# Patient Record
Sex: Female | Born: 1960 | Race: White | Hispanic: No | Smoking: Former smoker
Health system: Southern US, Community
[De-identification: ages and names within clinical notes are randomized; demographics above are authoritative.]

## PROBLEM LIST (undated history)

## (undated) DIAGNOSIS — M311 Thrombotic microangiopathy: Secondary | ICD-10-CM

## (undated) DIAGNOSIS — I10 Essential (primary) hypertension: Secondary | ICD-10-CM

## (undated) DIAGNOSIS — E7800 Pure hypercholesterolemia, unspecified: Secondary | ICD-10-CM

## (undated) DIAGNOSIS — I639 Cerebral infarction, unspecified: Secondary | ICD-10-CM

## (undated) DIAGNOSIS — M3119 Other thrombotic microangiopathy: Secondary | ICD-10-CM

## (undated) DIAGNOSIS — D638 Anemia in other chronic diseases classified elsewhere: Secondary | ICD-10-CM

## (undated) DIAGNOSIS — F32A Depression, unspecified: Secondary | ICD-10-CM

## (undated) DIAGNOSIS — F329 Major depressive disorder, single episode, unspecified: Secondary | ICD-10-CM

## (undated) HISTORY — PX: ABDOMINAL HYSTERECTOMY: SHX81

## (undated) HISTORY — DX: Anemia in other chronic diseases classified elsewhere: D63.8

## (undated) HISTORY — PX: TONSILLECTOMY: SUR1361

---

## 2001-08-07 ENCOUNTER — Encounter: Payer: Self-pay | Admitting: Internal Medicine

## 2001-08-07 ENCOUNTER — Ambulatory Visit (HOSPITAL_COMMUNITY): Admission: RE | Admit: 2001-08-07 | Discharge: 2001-08-07 | Payer: Self-pay | Admitting: Internal Medicine

## 2001-08-13 ENCOUNTER — Emergency Department (HOSPITAL_COMMUNITY): Admission: EM | Admit: 2001-08-13 | Discharge: 2001-08-13 | Payer: Self-pay | Admitting: Emergency Medicine

## 2001-08-13 ENCOUNTER — Encounter: Payer: Self-pay | Admitting: Emergency Medicine

## 2001-08-21 ENCOUNTER — Ambulatory Visit (HOSPITAL_COMMUNITY): Admission: RE | Admit: 2001-08-21 | Discharge: 2001-08-21 | Payer: Self-pay | Admitting: Internal Medicine

## 2007-01-25 ENCOUNTER — Ambulatory Visit (HOSPITAL_COMMUNITY): Admission: RE | Admit: 2007-01-25 | Discharge: 2007-01-25 | Payer: Self-pay | Admitting: Family Medicine

## 2007-05-17 ENCOUNTER — Emergency Department (HOSPITAL_COMMUNITY): Admission: EM | Admit: 2007-05-17 | Discharge: 2007-05-17 | Payer: Self-pay | Admitting: Emergency Medicine

## 2007-05-29 ENCOUNTER — Emergency Department (HOSPITAL_COMMUNITY): Admission: EM | Admit: 2007-05-29 | Discharge: 2007-05-29 | Payer: Self-pay | Admitting: Emergency Medicine

## 2010-08-13 NOTE — Procedures (Signed)
[facility]  Patient:    Kaitlyn Valdez, Kaitlyn Valdez Visit Number: [id number] MRN: [id number]          Service Type: OUT Location: RAD Attending Physician:  Elliot Cousin Dictated by:   Houtzdale Bing, M.D. Admit Date:  08/20/2001 Discharge Date: 08/20/2001   CC:         Elliot Cousin, M.D.   Echocardiograms  REFERRING PHYSICIAN:  Elliot Cousin, M.D.  CLINICAL DATA:  A 50 year old woman with chest pain.  INTERPRETATION: 1. Technically adequate echocardiographic study. 2. Normal left atrium, right atrium, and right ventricle. 3. Normal aortic, tricuspid, and pulmonic valves. 4. Mild mitral valve thickening with normal function and no prolapse. 5. Normal Doppler study with physiologic tricuspid regurgitation. 6. Normal internal dimension, wall thickness, regional and global function of    the left ventricle. 7. Normal IVC. Dictated by:   Puako Bing, M.D. Attending Physician:  Elliot Cousin DD:  08/21/01 TD:  08/22/01 Job: [id number]

## 2010-10-20 ENCOUNTER — Other Ambulatory Visit (HOSPITAL_COMMUNITY): Payer: Self-pay | Admitting: Family Medicine

## 2010-10-20 DIAGNOSIS — Z139 Encounter for screening, unspecified: Secondary | ICD-10-CM

## 2010-10-25 ENCOUNTER — Ambulatory Visit (HOSPITAL_COMMUNITY)
Admission: RE | Admit: 2010-10-25 | Discharge: 2010-10-25 | Disposition: A | Discharge status: Home / Self Care | Payer: Medicare Other | Source: Ambulatory Visit | Attending: Family Medicine | Admitting: Family Medicine

## 2010-10-25 DIAGNOSIS — Z1231 Encounter for screening mammogram for malignant neoplasm of breast: Secondary | ICD-10-CM | POA: Insufficient documentation

## 2010-10-25 DIAGNOSIS — Z139 Encounter for screening, unspecified: Secondary | ICD-10-CM

## 2013-03-08 ENCOUNTER — Telehealth: Payer: Self-pay

## 2013-03-08 NOTE — Telephone Encounter (Signed)
Pt was referred by Urgent Care at [facility] in [place] by Marva Panda for screening colonoscopy. I spoke to pt and she wanted me to speak to her brother, Orvilla Fus.  I scheduled her an office visit on 04/08/2013 at 11:00 AM with Gerrit Halls, NP. The notes indicated in her referral that she was to have platelets rechecked. Also, I'm not sure patient understood about having a colonoscopy.

## 2013-04-01 ENCOUNTER — Other Ambulatory Visit (HOSPITAL_COMMUNITY): Payer: Self-pay | Admitting: *Deleted

## 2013-04-01 DIAGNOSIS — Z139 Encounter for screening, unspecified: Secondary | ICD-10-CM

## 2013-04-04 ENCOUNTER — Ambulatory Visit (HOSPITAL_COMMUNITY): Payer: Medicare Other

## 2013-04-08 ENCOUNTER — Ambulatory Visit: Payer: Medicare Other | Admitting: Gastroenterology

## 2013-04-22 ENCOUNTER — Inpatient Hospital Stay (HOSPITAL_COMMUNITY)
Admission: AD | Admit: 2013-04-22 | Discharge: 2013-05-01 | DRG: 064 | Disposition: A | Discharge status: Skilled Nursing Facility | Payer: Medicare HMO | Source: Other Acute Inpatient Hospital | Attending: Internal Medicine | Admitting: Internal Medicine

## 2013-04-22 ENCOUNTER — Inpatient Hospital Stay (HOSPITAL_COMMUNITY): Payer: Medicare HMO

## 2013-04-22 DIAGNOSIS — I129 Hypertensive chronic kidney disease with stage 1 through stage 4 chronic kidney disease, or unspecified chronic kidney disease: Secondary | ICD-10-CM | POA: Diagnosis present

## 2013-04-22 DIAGNOSIS — R17 Unspecified jaundice: Secondary | ICD-10-CM | POA: Diagnosis present

## 2013-04-22 DIAGNOSIS — Z880 Allergy status to penicillin: Secondary | ICD-10-CM

## 2013-04-22 DIAGNOSIS — R339 Retention of urine, unspecified: Secondary | ICD-10-CM | POA: Diagnosis not present

## 2013-04-22 DIAGNOSIS — F32A Depression, unspecified: Secondary | ICD-10-CM | POA: Diagnosis present

## 2013-04-22 DIAGNOSIS — B1920 Unspecified viral hepatitis C without hepatic coma: Secondary | ICD-10-CM | POA: Diagnosis present

## 2013-04-22 DIAGNOSIS — R233 Spontaneous ecchymoses: Secondary | ICD-10-CM | POA: Diagnosis present

## 2013-04-22 DIAGNOSIS — I63511 Cerebral infarction due to unspecified occlusion or stenosis of right middle cerebral artery: Secondary | ICD-10-CM

## 2013-04-22 DIAGNOSIS — Z8249 Family history of ischemic heart disease and other diseases of the circulatory system: Secondary | ICD-10-CM

## 2013-04-22 DIAGNOSIS — G8190 Hemiplegia, unspecified affecting unspecified side: Secondary | ICD-10-CM | POA: Diagnosis present

## 2013-04-22 DIAGNOSIS — D599 Acquired hemolytic anemia, unspecified: Secondary | ICD-10-CM | POA: Diagnosis present

## 2013-04-22 DIAGNOSIS — I498 Other specified cardiac arrhythmias: Secondary | ICD-10-CM | POA: Diagnosis not present

## 2013-04-22 DIAGNOSIS — Z8673 Personal history of transient ischemic attack (TIA), and cerebral infarction without residual deficits: Secondary | ICD-10-CM

## 2013-04-22 DIAGNOSIS — R1310 Dysphagia, unspecified: Secondary | ICD-10-CM | POA: Diagnosis present

## 2013-04-22 DIAGNOSIS — N183 Chronic kidney disease, stage 3 unspecified: Secondary | ICD-10-CM | POA: Diagnosis present

## 2013-04-22 DIAGNOSIS — E870 Hyperosmolality and hypernatremia: Secondary | ICD-10-CM | POA: Diagnosis not present

## 2013-04-22 DIAGNOSIS — F3289 Other specified depressive episodes: Secondary | ICD-10-CM | POA: Diagnosis present

## 2013-04-22 DIAGNOSIS — N179 Acute kidney failure, unspecified: Secondary | ICD-10-CM | POA: Diagnosis present

## 2013-04-22 DIAGNOSIS — G936 Cerebral edema: Secondary | ICD-10-CM | POA: Diagnosis present

## 2013-04-22 DIAGNOSIS — E785 Hyperlipidemia, unspecified: Secondary | ICD-10-CM | POA: Diagnosis present

## 2013-04-22 DIAGNOSIS — R319 Hematuria, unspecified: Secondary | ICD-10-CM | POA: Diagnosis present

## 2013-04-22 DIAGNOSIS — I6350 Cerebral infarction due to unspecified occlusion or stenosis of unspecified cerebral artery: Principal | ICD-10-CM | POA: Diagnosis present

## 2013-04-22 DIAGNOSIS — E876 Hypokalemia: Secondary | ICD-10-CM | POA: Diagnosis not present

## 2013-04-22 DIAGNOSIS — D696 Thrombocytopenia, unspecified: Secondary | ICD-10-CM | POA: Diagnosis present

## 2013-04-22 DIAGNOSIS — M311 Thrombotic microangiopathy, unspecified: Secondary | ICD-10-CM | POA: Diagnosis present

## 2013-04-22 DIAGNOSIS — M3119 Other thrombotic microangiopathy: Secondary | ICD-10-CM | POA: Diagnosis present

## 2013-04-22 DIAGNOSIS — F329 Major depressive disorder, single episode, unspecified: Secondary | ICD-10-CM

## 2013-04-22 DIAGNOSIS — Z7902 Long term (current) use of antithrombotics/antiplatelets: Secondary | ICD-10-CM

## 2013-04-22 DIAGNOSIS — I69998 Other sequelae following unspecified cerebrovascular disease: Secondary | ICD-10-CM

## 2013-04-22 DIAGNOSIS — F17200 Nicotine dependence, unspecified, uncomplicated: Secondary | ICD-10-CM | POA: Diagnosis present

## 2013-04-22 DIAGNOSIS — R040 Epistaxis: Secondary | ICD-10-CM | POA: Diagnosis not present

## 2013-04-22 DIAGNOSIS — I10 Essential (primary) hypertension: Secondary | ICD-10-CM | POA: Diagnosis present

## 2013-04-22 DIAGNOSIS — F411 Generalized anxiety disorder: Secondary | ICD-10-CM | POA: Diagnosis present

## 2013-04-22 DIAGNOSIS — G9340 Encephalopathy, unspecified: Secondary | ICD-10-CM | POA: Diagnosis present

## 2013-04-22 HISTORY — DX: Cerebral infarction, unspecified: I63.9

## 2013-04-22 HISTORY — DX: Thrombotic microangiopathy: M31.1

## 2013-04-22 HISTORY — DX: Depression, unspecified: F32.A

## 2013-04-22 HISTORY — DX: Essential (primary) hypertension: I10

## 2013-04-22 HISTORY — DX: Major depressive disorder, single episode, unspecified: F32.9

## 2013-04-22 HISTORY — DX: Pure hypercholesterolemia, unspecified: E78.00

## 2013-04-22 HISTORY — DX: Other thrombotic microangiopathy: M31.19

## 2013-04-22 LAB — COMPREHENSIVE METABOLIC PANEL
ALK PHOS: 70 U/L (ref 39–117)
ALT: 28 U/L (ref 0–35)
AST: 54 U/L — AB (ref 0–37)
Albumin: 4 g/dL (ref 3.5–5.2)
BUN: 26 mg/dL — ABNORMAL HIGH (ref 6–23)
CALCIUM: 9.7 mg/dL (ref 8.4–10.5)
CO2: 21 mEq/L (ref 19–32)
Chloride: 101 mEq/L (ref 96–112)
Creatinine, Ser: 2.49 mg/dL — ABNORMAL HIGH (ref 0.50–1.10)
GFR calc Af Amer: 24 mL/min — ABNORMAL LOW (ref >90)
GFR, EST NON AFRICAN AMERICAN: 21 mL/min — AB (ref >90)
GLUCOSE: 107 mg/dL — AB (ref 70–99)
Potassium: 4.6 mEq/L (ref 3.7–5.3)
SODIUM: 139 meq/L (ref 137–147)
Total Bilirubin: 1.9 mg/dL — ABNORMAL HIGH (ref 0.3–1.2)
Total Protein: 7.4 g/dL (ref 6.0–8.3)

## 2013-04-22 LAB — CBC WITH DIFFERENTIAL/PLATELET
HCT: 33.4 % — ABNORMAL LOW (ref 36.0–46.0)
Hemoglobin: 11.6 g/dL — ABNORMAL LOW (ref 12.0–15.0)
LYMPHS ABS: 1.1 10*3/uL (ref 0.7–4.0)
Lymphocytes Relative: 18 % (ref 12–46)
MCH: 31.6 pg (ref 26.0–34.0)
MCHC: 34.7 g/dL (ref 30.0–36.0)
MCV: 91 fL (ref 78.0–100.0)
Monocytes Absolute: 1.1 10*3/uL — ABNORMAL HIGH (ref 0.1–1.0)
Monocytes Relative: 18 % — ABNORMAL HIGH (ref 3–12)
NEUTROS PCT: 64 % (ref 43–77)
Neutro Abs: 3.8 10*3/uL (ref 1.7–7.7)
PLATELETS: 10 10*3/uL — AB (ref 150–400)
RBC: 3.67 MIL/uL — AB (ref 3.87–5.11)
RDW: 14.1 % (ref 11.5–15.5)
WBC: 6.4 10*3/uL (ref 4.0–10.5)

## 2013-04-22 LAB — PROTIME-INR
INR: 1.11 (ref 0.00–1.49)
Prothrombin Time: 14.1 seconds (ref 11.6–15.2)

## 2013-04-22 LAB — PHOSPHORUS: Phosphorus: 5.4 mg/dL — ABNORMAL HIGH (ref 2.3–4.6)

## 2013-04-22 LAB — LACTATE DEHYDROGENASE: LDH: 946 U/L — ABNORMAL HIGH (ref 94–250)

## 2013-04-22 LAB — MRSA PCR SCREENING: MRSA by PCR: NEGATIVE

## 2013-04-22 LAB — MAGNESIUM: Magnesium: 2 mg/dL (ref 1.5–2.5)

## 2013-04-22 LAB — APTT: aPTT: 36 seconds (ref 24–37)

## 2013-04-22 MED ORDER — ONDANSETRON HCL 4 MG/2ML IJ SOLN
4.0000 mg | Freq: Three times a day (TID) | INTRAMUSCULAR | Status: DC | PRN
  Administered 2013-04-22: 4 mg via INTRAVENOUS
  Filled 2013-04-22: qty 2

## 2013-04-22 MED ORDER — ACETAMINOPHEN 325 MG PO TABS
650.0000 mg | ORAL_TABLET | ORAL | Status: DC | PRN
Start: 2013-04-22 — End: 2013-04-25

## 2013-04-22 MED ORDER — PANTOPRAZOLE SODIUM 40 MG IV SOLR
40.0000 mg | INTRAVENOUS | Status: DC
  Administered 2013-04-22 – 2013-04-26 (×5): 40 mg via INTRAVENOUS
  Filled 2013-04-22 (×6): qty 40

## 2013-04-22 MED ORDER — DIPHENHYDRAMINE HCL 50 MG/ML IJ SOLN
25.0000 mg | Freq: Four times a day (QID) | INTRAMUSCULAR | Status: DC | PRN
  Administered 2013-04-27: 25 mg via INTRAVENOUS
  Filled 2013-04-22 (×2): qty 1

## 2013-04-22 MED ORDER — SODIUM CHLORIDE 0.9 % IV SOLN
INTRAVENOUS | Status: DC
  Administered 2013-04-23: 1000 mL via INTRAVENOUS

## 2013-04-22 MED ORDER — HEPARIN SODIUM (PORCINE) 1000 UNIT/ML IJ SOLN
3000.0000 [IU] | Freq: Once | INTRAMUSCULAR | Status: AC
Start: 2013-04-22 — End: 2013-04-22
  Administered 2013-04-22: 2800 [IU]
  Filled 2013-04-22: qty 3

## 2013-04-22 NOTE — Procedures (Signed)
Hemodialysis Catheter Insertion Procedure Note Kaitlyn Valdez [id number] [birth year]  Procedure: Insertion of Central Venous Catheter Indications: Hemodialysis  Procedure Details Consent: Risks of procedure as well as the alternatives and risks of each were explained to the (patient/caregiver).  Consent for procedure obtained.  Time Out: Verified patient identification, verified procedure, site/side was marked, verified correct patient position, special equipment/implants available, medications/allergies/relevent history reviewed, required imaging and test results available.   Performed  Maximum sterile technique was used including antiseptics, cap, gloves, gown, hand hygiene, mask and sheet. Skin prep: Chlorhexidine; local anesthetic administered  A triple lumen hemodialysis catheter was placed in the left internal jugular vein to 20 cm using the Seldinger technique.  Evaluation Blood flow good Complications: No apparent complications Patient did tolerate procedure well. Chest X-ray ordered to verify placement.  CXR: pending.  Procedure performed under direct supervision of Dr. Sung AmabileSimonds and with ultrasound guidance for real time vessel cannulation.      Kaitlyn BrimBrandi Ollis, NP-C [place] Pgr: [phone] or [phone]    04/22/2013, 9:47 PM  I was present for and supervised the entire procedure  Billy Fischeravid Simonds, MD ; [facility] service Mobile [phone].  After 5:30 PM or weekends, call [phone]

## 2013-04-22 NOTE — H&P (Signed)
PULMONARY / CRITICAL CARE MEDICINE  Name: Kaitlyn Valdez MRN: [id number] DOB: 1960-12-03    ADMISSION DATE:  04/22/2013 CONSULTATION DATE:  04/22/2013  [facility]ING [facility] :  [facility] ED[facility] PRIMARY SERVICE:  PCCM  CHIEF COMPLAINT:  Acute encephalopathy  BRIEF PATIENT DESCRIPTION: 53 yo with past medical history of CVA and baseline L side neglect found down at home, covered in emesis, confused with slurred speech and worsen L sided neglect.  Workup revealed thrombocytopenia.  Head CT revealed no acute findings but multiple prior infarcts. [facility]   SIGNIFICANT EVENTS / STUDIES:  1/26  Found down, low platelets 1/26  Head CT [facility] >>> nad, multiple old infarcts 1/26  [facility]   LINES / TUBES:  CULTURES: 1/26 Blood >>> 1/26 Urine >>>  ANTIBIOTICS:  The patient is encephalopathic and unable to provide history, which was obtained for available medical records.  HISTORY OF PRESENT ILLNESS:  53 yo with past medical history of CVA and baseline L side neglect found down at home, covered in emesis, confused with slurred speech and worsen L sided neglect.  Workup revealed thrombocytopenia.  Head CT revealed no acute findings but multiple prior infarcts. [facility].   PAST MEDICAL HISTORY :  No past medical history on file. No past surgical history on file. Prior to Admission medications   Not on File   Allergies not on file  FAMILY HISTORY:  No family history on file.  SOCIAL HISTORY:  has no tobacco, alcohol, and drug history on file.  REVIEW OF SYSTEMS:  Unable to provide.  INTERVAL HISTORY:  VITAL SIGNS: Temp:  [99.2 F (37.3 C)] 99.2 F (37.3 C) (01/26 1900) Pulse Rate:  [70-82] 82 (01/26 1900) Resp:  [18-19] 19 (01/26 1900) BP: (118)/(77) 118/77 mmHg (01/26 1900) SpO2:  [100 %] 100 % (01/26 1900) HEMODYNAMICS:   VENTILATOR SETTINGS:   INTAKE / OUTPUT: Intake/Output   None     PHYSICAL  EXAMINATION: General:  Appears ill Neuro:  Encephalopathic, with L sided neglect, cough / gag present HEENT:  Dry membranes Cardiovascular:  Regular tachycardic Lungs:  Bilateral air entry Abdomen:  Soft, nontender, bowel sounds diminished Musculoskeletal:  Moves all extremities, no edema Skin:  Petechiae in the back, and excoriations UE  LABS: CBC No results found for this basename: WBC, HGB, HCT, PLT,  in the last 168 hours Coag's No results found for this basename: APTT, INR,  in the last 168 hours BMET No results found for this basename: NA, K, CL, CO2, BUN, CREATININE, GLUCOSE,  in the last 168 hours Electrolytes No results found for this basename: CALCIUM, MG, PHOS,  in the last 168 hours Sepsis Markers No results found for this basename: LATICACIDVEN, PROCALCITON, O2SATVEN,  in the last 168 hours ABG No results found for this basename: PHART, PCO2ART, PO2ART,  in the last 168 hours Liver Enzymes No results found for this basename: AST, ALT, ALKPHOS, BILITOT, ALBUMIN,  in the last 168 hours Cardiac Enzymes No results found for this basename: TROPONINI, PROBNP,  in the last 168 hours Glucose No results found for this basename: GLUCAP,  in the last 168 hours  CXR:  1/26 >>>  ASSESSMENT / PLAN:  PULMONARY A:   Protects airway for now P:   Goal SpO2>92 Supplemental oxygen  CARDIOVASCULAR A:  H/o HTN, dyslipidemia P:  Goal MAP>60 Hold Plavix / Valsartan / Pravachol  RENAL A:   No active issues P:  Trend BMP NS@100   GASTROINTESTINAL A:   Nutrition GI Px P:   NPO Avoid NGT while thrombocytopenic Protonix for GI Px LFT  HEMATOLOGIC A:   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 MRI  I have personally obtained history, examined patient, evaluated and interpreted laboratory and imaging results, reviewed medical records, formulated assessment / plan and placed orders.  CRITICAL CARE:  The patient is critically ill with multiple organ systems failure and requires high complexity decision making for assessment and support, frequent evaluation and titration of therapies, application of advanced monitoring technologies and extensive interpretation of multiple databases. Critical Care Time devoted to patient care services described in this note is 40 minutes.   Lonia Farber, MD Pulmonary and Critical Care Medicine [facility] Pager: [phone]  04/22/2013, 8:58 PM

## 2013-04-22 NOTE — Progress Notes (Signed)
Discussed case with Dr. Marisue HumbleSanford.  Verified that Hematology has been called for consult (was called earlier in the evening).  LDH requested per Renal & ordered.    Canary BrimBrandi Aneka Fagerstrom, NP-C [place] [facility] Pgr: [phone] or [phone]

## 2013-04-22 NOTE — Consult Note (Addendum)
Kaitlyn Valdez 04/22/2013 Kaitlyn Valdez Requesting Physician:  Zubelevitskiy  Reason for Consult:  Concern for TTP, req for plasmapheresis, called at 10:30pm.  HPI:  2F w/ unclear medical history but potentially including CVA (notes mention L sided neglect), MDD and recent medication change per [facility] ED notes tranferred to [facility] after presenting to [facility] with AMS, N/V and finding TCP with PLT of 22 and concern for TTP and potential for TPE.  Pt is awake and alert but unable to provide further history.  Family is currently unavailable.    Here pt with PLT count of 10, schistocytes on CBC, Tbili 1.9, and Hb of 11.6.  PT/PTT are normal.  SCr is 2.49 with unknown baseline; UA at OSH with significant hematuria.  She has been afebrile to this point.     Creatinine, Ser (mg/dL)  Date Value  09/15/3084 2.49*    ROS Unable to obtain  PMH: unable to obtain PSH unable to obtain FH unable to obtain SH unable to obtain Allergies unable to obtain Home medications Prior to Admission medications   Not on File    Current Medications Current Facility-Administered Medications  Medication Dose Route Frequency Provider Last Rate Last Dose  . 0.9 %  sodium chloride infusion   Intravenous Continuous Lonia Farber, MD      . acetaminophen (TYLENOL) tablet 650 mg  650 mg Oral Q4H PRN Lonia Farber, MD      . diphenhydrAMINE (BENADRYL) injection 25 mg  25 mg Intravenous Q6H PRN Lonia Farber, MD      . ondansetron (ZOFRAN) injection 4 mg  4 mg Intravenous Q8H PRN Courtney Paris, MD   4 mg at 04/22/13 2239  . pantoprazole (PROTONIX) injection 40 mg  40 mg Intravenous Q24H Lonia Farber, MD   40 mg at 04/22/13 2000    CBC  Recent Labs Lab 04/22/13 2105  WBC 6.4  NEUTROABS 3.8  HGB 11.6*  HCT 33.4*  MCV 91.0  PLT 10*   Basic Metabolic Panel  Recent Labs Lab 04/22/13 2105  NA 139  K 4.6  CL 101  CO2 21  GLUCOSE 107*  BUN 26*  CREATININE 2.49*   CALCIUM 9.7  PHOS 5.4*    Physical Exam  Blood pressure 107/80, pulse 77, temperature 99.2 F (37.3 C), temperature source Oral, resp. rate 20, SpO2 96.00%. GEN: NAD, lying in bed, awake but not oriented ENT: fair dentition.  NECK: L IJ HD catheter in place EYES: Sclera and conjunctiva clear CV: RRR, nl s1s2 PULM: CTAB, nl wob ABD: s/nt/nd.  SKIN: no rashes/lesions.  No clear petechiate EXT:no LEE   A/P 2F presenting with AMS, TCP, mild anemia with schistocytes on CBC, and elevated SCr (unclear chronicity at this time) with normal PT/pTT, no history of diarrhea or fever.  From the [facility] notes there is mention of new medication for depression started last week.    Certainly this could be TTP.  I have spoken with hematology on call and reviewed case; after their review there are some concerns this could be something other than TTP and they intend to see in AM.  I have offered to order TPE therapy tonight and HD catheter has been placed.  Should mental status worsen tonight or seizures develop I think immediate TPE should be reconsidered.    ASSESMENT 1. TCP, ? TTP 2. Renal Failure, unclear acute vs chronic 3. AMS  PLAN 1. Ordered ADAMTS13 Activity, Retic Count, Smear held, LDH 2. Available to order TPE 3.  Follow daily renal function -- WOuld benefit from old records at [facility] or [facility] 4. UA and Renal US 5. NSAID avoidance 6. I defer consideration of corticosteroids to hematology  Sabra Heckyan Suellen Durocher MD 04/22/2013, 11:16 PM

## 2013-04-23 ENCOUNTER — Inpatient Hospital Stay (HOSPITAL_COMMUNITY): Payer: Medicare HMO

## 2013-04-23 ENCOUNTER — Encounter (HOSPITAL_COMMUNITY): Payer: Self-pay | Admitting: Internal Medicine

## 2013-04-23 DIAGNOSIS — I63511 Cerebral infarction due to unspecified occlusion or stenosis of right middle cerebral artery: Secondary | ICD-10-CM

## 2013-04-23 DIAGNOSIS — N179 Acute kidney failure, unspecified: Secondary | ICD-10-CM

## 2013-04-23 DIAGNOSIS — D649 Anemia, unspecified: Secondary | ICD-10-CM

## 2013-04-23 DIAGNOSIS — F3289 Other specified depressive episodes: Secondary | ICD-10-CM

## 2013-04-23 DIAGNOSIS — M311 Thrombotic microangiopathy, unspecified: Secondary | ICD-10-CM

## 2013-04-23 DIAGNOSIS — R509 Fever, unspecified: Secondary | ICD-10-CM

## 2013-04-23 DIAGNOSIS — Z8673 Personal history of transient ischemic attack (TIA), and cerebral infarction without residual deficits: Secondary | ICD-10-CM

## 2013-04-23 DIAGNOSIS — F329 Major depressive disorder, single episode, unspecified: Secondary | ICD-10-CM

## 2013-04-23 LAB — RETICULOCYTES
RBC.: 3.2 MIL/uL — AB (ref 3.87–5.11)
RETIC CT PCT: 1.2 % (ref 0.4–3.1)
Retic Count, Absolute: 38.4 10*3/uL (ref 19.0–186.0)

## 2013-04-23 LAB — URINALYSIS, ROUTINE W REFLEX MICROSCOPIC
GLUCOSE, UA: NEGATIVE mg/dL
KETONES UR: 15 mg/dL — AB
Leukocytes, UA: NEGATIVE
Nitrite: NEGATIVE
PH: 6.5 (ref 5.0–8.0)
Protein, ur: >300 mg/dL — AB
Specific Gravity, Urine: 1.021 (ref 1.005–1.030)
Urobilinogen, UA: 1 mg/dL (ref 0.0–1.0)

## 2013-04-23 LAB — RENAL FUNCTION PANEL
Albumin: 3.3 g/dL — ABNORMAL LOW (ref 3.5–5.2)
BUN: 30 mg/dL — ABNORMAL HIGH (ref 6–23)
CO2: 29 meq/L (ref 19–32)
Calcium: 8.9 mg/dL (ref 8.4–10.5)
Chloride: 102 mEq/L (ref 96–112)
Creatinine, Ser: 2.67 mg/dL — ABNORMAL HIGH (ref 0.50–1.10)
GFR calc non Af Amer: 19 mL/min — ABNORMAL LOW (ref >90)
GFR, EST AFRICAN AMERICAN: 22 mL/min — AB (ref >90)
GLUCOSE: 91 mg/dL (ref 70–99)
POTASSIUM: 3.5 meq/L — AB (ref 3.7–5.3)
Phosphorus: 4.4 mg/dL (ref 2.3–4.6)
SODIUM: 144 meq/L (ref 137–147)

## 2013-04-23 LAB — POCT I-STAT, CHEM 8
BUN: 33 mg/dL — AB (ref 6–23)
CHLORIDE: 104 meq/L (ref 96–112)
Calcium, Ion: 1.23 mmol/L (ref 1.12–1.23)
Creatinine, Ser: 3 mg/dL — ABNORMAL HIGH (ref 0.50–1.10)
Glucose, Bld: 102 mg/dL — ABNORMAL HIGH (ref 70–99)
HEMATOCRIT: 28 % — AB (ref 36.0–46.0)
Hemoglobin: 9.5 g/dL — ABNORMAL LOW (ref 12.0–15.0)
POTASSIUM: 3.9 meq/L (ref 3.7–5.3)
Sodium: 142 mEq/L (ref 137–147)
TCO2: 23 mmol/L (ref 0–100)

## 2013-04-23 LAB — URINE MICROSCOPIC-ADD ON

## 2013-04-23 LAB — GLUCOSE, CAPILLARY
GLUCOSE-CAPILLARY: 165 mg/dL — AB (ref 70–99)
GLUCOSE-CAPILLARY: 95 mg/dL (ref 70–99)
GLUCOSE-CAPILLARY: 98 mg/dL (ref 70–99)
Glucose-Capillary: 104 mg/dL — ABNORMAL HIGH (ref 70–99)
Glucose-Capillary: 103 mg/dL — ABNORMAL HIGH (ref 70–99)
Glucose-Capillary: 103 mg/dL — ABNORMAL HIGH (ref 70–99)
Glucose-Capillary: 93 mg/dL (ref 70–99)

## 2013-04-23 LAB — ABO/RH: ABO/RH(D): A POS

## 2013-04-23 LAB — HEMOGLOBIN A1C
HEMOGLOBIN A1C: 4.8 % (ref <5.7)
Mean Plasma Glucose: 91 mg/dL (ref <117)

## 2013-04-23 LAB — SAVE SMEAR

## 2013-04-23 LAB — PREPARE PLATELET PHERESIS: UNIT DIVISION: 0

## 2013-04-23 LAB — SODIUM: Sodium: 149 mEq/L — ABNORMAL HIGH (ref 137–147)

## 2013-04-23 LAB — HAPTOGLOBIN: Haptoglobin: 68 mg/dL (ref 45–215)

## 2013-04-23 MED ORDER — HALOPERIDOL LACTATE 5 MG/ML IJ SOLN
2.0000 mg | INTRAMUSCULAR | Status: AC | PRN
  Administered 2013-04-24 – 2013-04-29 (×3): 2 mg via INTRAVENOUS
  Filled 2013-04-23 (×3): qty 1

## 2013-04-23 MED ORDER — THIAMINE HCL 100 MG/ML IJ SOLN: 500.0000 mg | Freq: Every day | INTRAMUSCULAR | Status: DC

## 2013-04-23 MED ORDER — DIPHENHYDRAMINE HCL 25 MG PO CAPS: 25.0000 mg | ORAL_CAPSULE | Freq: Four times a day (QID) | ORAL | Status: DC | PRN

## 2013-04-23 MED ORDER — SODIUM CHLORIDE 3 % IV SOLN
INTRAVENOUS | Status: DC
  Administered 2013-04-23: 23:00:00 via INTRAVENOUS
  Administered 2013-04-23: 75 mL/h via INTRAVENOUS
  Administered 2013-04-24: 37.5 mL/h via INTRAVENOUS
  Filled 2013-04-23 (×5): qty 500

## 2013-04-23 MED ORDER — ANTICOAGULANT SODIUM CITRATE 4% (200MG/5ML) IV SOLN
5.0000 mL | Freq: Once | Status: AC
  Administered 2013-04-23: 5 mL
  Filled 2013-04-23: qty 250

## 2013-04-23 MED ORDER — ACD FORMULA A 0.73-2.45-2.2 GM/100ML VI SOLN
500.0000 mL | Status: DC
  Administered 2013-04-23: 500 mL via INTRAVENOUS
  Filled 2013-04-23: qty 500

## 2013-04-23 MED ORDER — ACD FORMULA A 0.73-2.45-2.2 GM/100ML VI SOLN
Status: AC
  Administered 2013-04-23: 500 mL via INTRAVENOUS
  Filled 2013-04-23: qty 500

## 2013-04-23 MED ORDER — ACETAMINOPHEN 325 MG PO TABS: 650.0000 mg | ORAL_TABLET | ORAL | Status: DC | PRN

## 2013-04-23 MED ORDER — CALCIUM GLUCONATE 10 % IV SOLN
2.0000 g | Freq: Once | INTRAVENOUS | Status: DC
  Filled 2013-04-23: qty 20

## 2013-04-23 MED ORDER — SODIUM CHLORIDE 0.9 % IV SOLN
4.0000 g | Freq: Once | INTRAVENOUS | Status: AC
  Administered 2013-04-23: 4 g via INTRAVENOUS
  Filled 2013-04-23: qty 40

## 2013-04-23 NOTE — Consult Note (Signed)
[place] NOTE  Patient Care Team: Maricela Curet, MD as PCP - General (Internal Medicine)  CHIEF COMPLAINTS/PURPOSE OF CONSULTATION:  TTP  HISTORY OF PRESENTING ILLNESS:  Kaitlyn Valdez 53 y.o. female is here because of thrombocytopenia. She was transferred from another facility to begin plasmapheresis. A review of her records extensively and attempted to collaborate history with the patient. She is intermittently confused and not fully oriented. She was able to give the history of past history of TTP treated a long time ago at [facility]. She has not been treated for long time. She has been placed on Plavix for history of recurrent stroke. She denies recent bleeding although the nurse stated that she had history of recent epistaxis. According to some scant records dated 04/19/2013, she had a low platelet count of about 40-50,000 with mild elevated creatinine of 1.4. She does not have anemia at the time of diagnosis. Apparently, when she was seen at urgent care, she has some symptoms of difficulty with dysphasia on top of background history of neurological deficit. There were no reported history of seizures.  MEDICAL HISTORY: unreliable, according to outside records, had history of hypertension, hyperlipidemia, depression, recurrent history of stroke SURGICAL HISTORY: Unreliable, according to outside records, had tonsillectomy, hysterectomy and heart surgery SOCIAL HISTORY: She lives with her brother and sister in law  History   Social History  . Marital Status: Divorced    Spouse Name: N/A    Number of Children: N/A  . Years of Education: N/A   Occupational History  . Not on file.   Social History Main Topics  . Smoking status: Not on file  . Smokeless tobacco: Not on file  . Alcohol Use: Not on file  . Drug Use: Not on file  . Sexual Activity: Not on file   Other Topics Concern  . Not on file   Social History Narrative  . No narrative on file     FAMILY HISTORY: Unable to obtain due to confusion  ALLERGIES:  has no allergies on file.  MEDICATIONS:  Current Facility-Administered Medications  Medication Dose Route Frequency Provider Last Rate Last Dose  . 0.9 %  sodium chloride infusion   Intravenous Continuous Doree Fudge, MD      . acetaminophen (TYLENOL) tablet 650 mg  650 mg Oral Q4H PRN Doree Fudge, MD      . diphenhydrAMINE (BENADRYL) injection 25 mg  25 mg Intravenous Q6H PRN Doree Fudge, MD      . ondansetron (ZOFRAN) injection 4 mg  4 mg Intravenous Q8H PRN Corky Sox, MD   4 mg at 04/22/13 2239  . pantoprazole (PROTONIX) injection 40 mg  40 mg Intravenous Q24H Doree Fudge, MD   40 mg at 04/22/13 2000    REVIEW OF SYSTEMS:  Unreliable due to altered mental status  PHYSICAL EXAMINATION: ECOG PERFORMANCE STATUS: 2 - Symptomatic, <50% confined to bed  Filed Vitals:   04/23/13 0030  BP: 124/77  Pulse: 99  Temp:   Resp:    There were no vitals filed for this visit.  GENERAL:alert, no distress and comfortable SKIN: skin color, texture, turgor are normal, noted some petechiae rash, no ischemic changes EYES: normal, conjunctiva are pink and non-injected, sclera clear OROPHARYNX:no exudate, no erythema and lips, buccal mucosa, and tongue normal  NECK: supple, thyroid normal size, non-tender, without nodularity LYMPH:  no palpable lymphadenopathy in the cervical, axillary or inguinal LUNGS: clear to auscultation and percussion with normal  breathing effort HEART: regular rate & rhythm and no murmurs and no lower extremity edema ABDOMEN:abdomen soft, non-tender and normal bowel sounds Musculoskeletal:no cyanosis of digits and no clubbing  PSYCH: alert , only oriented in person with dysarthria NEURO: Evidence of hemi neglect and left-sided weakness  LABORATORY DATA:  I have reviewed the data as listed Recent Results (from the past 2160 hour(s))  PREPARE PLATELET  PHERESIS     Status: None   Collection Time    04/22/13  7:00 PM      Result Value Range   Unit Number [id number]     Blood Component Type PLTPHER LR1     Unit division 00     Status of Unit ISSUED     Transfusion Status OK TO TRANSFUSE    MRSA PCR SCREENING     Status: None   Collection Time    04/22/13  7:04 PM      Result Value Range   MRSA by PCR NEGATIVE  NEGATIVE   Comment:            The GeneXpert MRSA Assay (FDA     approved for NASAL specimens     only), is one component of a     comprehensive MRSA colonization     surveillance program. It is not     intended to diagnose MRSA     infection nor to guide or     monitor treatment for     MRSA infections.  CBC WITH DIFFERENTIAL     Status: Abnormal   Collection Time    04/22/13  9:05 PM      Result Value Range   WBC 6.4  4.0 - 10.5 K/uL   RBC 3.67 (*) 3.87 - 5.11 MIL/uL   Hemoglobin 11.6 (*) 12.0 - 15.0 g/dL   HCT 33.4 (*) 36.0 - 46.0 %   MCV 91.0  78.0 - 100.0 fL   MCH 31.6  26.0 - 34.0 pg   MCHC 34.7  30.0 - 36.0 g/dL   RDW 14.1  11.5 - 15.5 %   Platelets 10 (*) 150 - 400 K/uL   Comment: REPEATED TO VERIFY     SPECIMEN CHECKED FOR CLOTS     PLATELETS APPEAR DECREASED     PLATELET COUNT CONFIRMED BY SMEAR     CRITICAL RESULT CALLED TO, READ BACK BY AND VERIFIED WITH:     M CROSS,RN 2212 04/22/13 WBOND   Neutrophils Relative % 64  43 - 77 %   Neutro Abs 3.8  1.7 - 7.7 K/uL   Lymphocytes Relative 18  12 - 46 %   Lymphs Abs 1.1  0.7 - 4.0 K/uL   Monocytes Relative 18 (*) 3 - 12 %   Monocytes Absolute 1.1 (*) 0.1 - 1.0 K/uL   RBC Morphology SCHISTOCYTES NOTED ON SMEAR     Comment: PLATELETS APPEAR DECREASED  COMPREHENSIVE METABOLIC PANEL     Status: Abnormal   Collection Time    04/22/13  9:05 PM      Result Value Range   Sodium 139  137 - 147 mEq/L   Potassium 4.6  3.7 - 5.3 mEq/L   Chloride 101  96 - 112 mEq/L   CO2 21  19 - 32 mEq/L   Glucose, Bld 107 (*) 70 - 99 mg/dL   BUN 26 (*) 6 - 23 mg/dL    Creatinine, Ser 2.49 (*) 0.50 - 1.10 mg/dL   Calcium 9.7  8.4 - 10.5 mg/dL  Total Protein 7.4  6.0 - 8.3 g/dL   Albumin 4.0  3.5 - 5.2 g/dL   AST 54 (*) 0 - 37 U/L   ALT 28  0 - 35 U/L   Alkaline Phosphatase 70  39 - 117 U/L   Total Bilirubin 1.9 (*) 0.3 - 1.2 mg/dL   GFR calc non Af Amer 21 (*) >90 mL/min   GFR calc Af Amer 24 (*) >90 mL/min   Comment: (NOTE)     The eGFR has been calculated using the CKD EPI equation.     This calculation has not been validated in all clinical situations.     eGFR's persistently <90 mL/min signify possible Chronic Kidney     Disease.  APTT     Status: None   Collection Time    04/22/13  9:05 PM      Result Value Range   aPTT 36  24 - 37 seconds  PROTIME-INR     Status: None   Collection Time    04/22/13  9:05 PM      Result Value Range   Prothrombin Time 14.1  11.6 - 15.2 seconds   INR 1.11  0.00 - 1.49  MAGNESIUM     Status: None   Collection Time    04/22/13  9:05 PM      Result Value Range   Magnesium 2.0  1.5 - 2.5 mg/dL  PHOSPHORUS     Status: Abnormal   Collection Time    04/22/13  9:05 PM      Result Value Range   Phosphorus 5.4 (*) 2.3 - 4.6 mg/dL  TYPE AND SCREEN     Status: None   Collection Time    04/22/13  9:05 PM      Result Value Range   ABO/RH(D) A POS     Antibody Screen NEG     Sample Expiration 04/25/2013    LACTATE DEHYDROGENASE     Status: Abnormal   Collection Time    04/22/13  9:05 PM      Result Value Range   LDH 946 (*) 94 - 250 U/L  GLUCOSE, CAPILLARY     Status: None   Collection Time    04/23/13 12:01 AM      Result Value Range   Glucose-Capillary 98  70 - 99 mg/dL   Comment 1 Documented in Chart     Comment 2 Notify RN     I reviewed her peripheral smear which showed 5 schistocytes per high power field  ASSESSMENT:  Thrombocytopenia, 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 The patient also have mild low-grade fever. I discussed with critical care physician. Preliminary diagnosis certainly could suggest TTP. Evidence of early sepsis also cannot be rule out. Given her history of TTP in the past, recurrence of TTP is certainly possible.  I recommend we obtain records from [facility] in the morning I recommend to proceed with plasmapheresis.  #2 anemia This could be due to hemolysis. I recommend recheck bilirubin and haptoglobin in the morning  #3 acute renal failure  The patient will proceed with plasmapheresis as above  #4 history of stroke  I recommend not to restart her on Plavix in the future due to risk of TTP with  Plavix which has been reported

## 2013-04-23 NOTE — Progress Notes (Addendum)
Subjective:  Sneezing, epistaxis Awake, answers questions TPE started at around 7AM (hematology saw pt in the early AM and felt as did Dr. Marisue Humble that TTP likely Dx and then asked renal to write the orders) Pt indicates had TTP treated in [facility] in the past but can't tell me when or who her MD was Says was never told of any kidney failure and doesn't think she ever had dialysis in the past when treated for TTP (although her mother was on dialysis - [place] [facility] - cause of her kidney failure not known to pt)   Objective Vital signs in last 24 hours: Filed Vitals:   04/23/13 0702 04/23/13 0716 04/23/13 0724 04/23/13 0731  BP: 119/77 117/64 151/72 111/61  Pulse: 89 98 95 121  Temp: 99 F (37.2 C) 98.8 F (37.1 C) 98.4 F (36.9 C) 99.6 F (37.6 C)  TempSrc: Oral Oral Oral Oral  Resp: 18 22 18 16   Weight:      SpO2: 99% 100% 100% 100%   Weight change:   Intake/Output Summary (Last 24 hours) at 04/23/13 0739 Last data filed at 04/22/13 2215  Gross per 24 hour  Intake   12.5 ml  Output      0 ml  Net   12.5 ml   Physical Exam:  Blood pressure 111/61, pulse 121, temperature 99.6 F (37.6 C), temperature source Oral, resp. rate 16, weight 57 kg (125 lb 10.6 oz), SpO2 100.00%. Epistaxis/sneezing blood Left IJ catheter Awake Dysarthric speech Lungs grossly clear S1S2 No S3 Soft abd No edema of LE's   Recent Labs  04/22/13 2105  NA 139  K 4.6  CL 101  CO2 21  GLUCOSE 107*  BUN 26*  CREATININE 2.49*  CALCIUM 9.7  MG 2.0  PHOS 5.4*    Recent Labs  04/22/13 2105  AST 54*  ALT 28  ALKPHOS 70  BILITOT 1.9*  PROT 7.4  ALBUMIN 4.0    Recent Labs  04/22/13 2105  WBC 6.4  NEUTROABS 3.8  HGB 11.6*  HCT 33.4*  MCV 91.0  PLT 10*   Anemia Panel:  Recent Labs  04/23/13 0100  RETICCTPCT 1.2    Results for PRUDIE, GUTHRIDGE (MRN [id number]) as of 04/23/2013 07:44  Ref. Range 04/22/2013 21:05  LDH Latest Range: 94-250 U/L 946 (H)  Results for ZOIEY, CHRISTY (MRN [id number]) as of 04/23/2013 07:44  Ref. Range 04/22/2013 21:05  RBC Morphology No range found SCHISTOCYTES NOTED ON SMEAR   Iron Studies: No results found for this basename: IRON, TIBC, TRANSFERRIN, FERRITIN,  in the last 168 hours Studies/Results: Dg Chest Port 1 View  04/22/2013   CLINICAL DATA:  Left IJ dialysis catheter insertion  EXAM: PORTABLE CHEST - 1 VIEW  COMPARISON:  None.  FINDINGS: Left IJ dialysis catheter tip terminates in the mid SVC level. Normal heart size and vascularity. Lungs remain clear. No effusion or pneumothorax.  IMPRESSION: Left IJ dialysis catheter tip mid SVC level  No acute finding   Electronically Signed   By: Ruel Favors M.D.   On: 04/22/2013 22:14   Medications: . sodium chloride    . citrate dextrose     . anticoagulant sodium citrate  5 mL Intracatheter Once  . calcium gluconate IVPB  4 g Intravenous Once  . calcium gluconate  2 g Intravenous Once  . pantoprazole (PROTONIX) IV  40 mg Intravenous Q24H    Assessment/Plan 1. TTP  -  1st TPE today; daily for now  with serial monitoring of LDH, CBC and clinical status; ADAMTS13 pending. Hematology following 2. 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 H/O strokes  4. HTN 5. HLD    Camille Balynthia Yohann Curl, MD [facility] [phone] pager 04/23/2013, 7:39 AM

## 2013-04-23 NOTE — Progress Notes (Signed)
CRITICAL VALUE ALERT  Critical value received:  PLT 10,000  Date of notification:  04/22/2013   Time of notification: 2200  Critical value read back: Yes  Nurse who received alert:  Jodene NamMonica Caprice Wasko RN    MD notified (1st page):  Canary BrimBrandi Ollis NP   Time of first page:  2205  MD notified (2nd page):  Time of second page:  Responding MD:  Canary BrimBrandi Ollis   Time MD responded:  2205

## 2013-04-23 NOTE — Progress Notes (Addendum)
Called Radiology x 2 at 10:30 and 13:00. MRI unable to schedule a time for RN to take pt at this time. Left phone number with MRI to call when able to schedule a time.

## 2013-04-23 NOTE — Procedures (Signed)
I have personally attended this patient's TPE session. 1.5 plasma volume exchange with FFP. Access left IJ catheter    Kaitlyn Valdez B

## 2013-04-23 NOTE — Progress Notes (Signed)
Name: Kaitlyn Valdez MRN: [id number] DOB: [birth year]    ADMISSION DATE:  04/22/2013 CONSULTATION DATE: 04/22/2013   REFERRING MD : [facility]   PRIMARY SERVICE: PCCM   CHIEF COMPLAINT: Acute encephalopathy   BRIEF PATIENT DESCRIPTION: 53 yo with past medical history of CVA and baseline L side neglect found down at home, covered in emesis, confused with slurred speech and worsen L sided neglect. Workup revealed thrombocytopenia. Head CT revealed no acute findings but multiple prior infarcts. Transferred to [facility] for plasma phoresis.  SIGNIFICANT EVENTS / STUDIES:  1/26 Found down, low platelets  1/26 Head CT [facility]) >>> no acute abnormality, multiple old infarcts  1/26 Transferred to [facility] for plasmapheresis   LINES / TUBES:  Trialysis catheter 1/26 >>> PIV 1/26 >>>  CULTURES:  1/26 Blood >>>  1/26 Urine >>>   ANTIBIOTICS:  none  SUBJECTIVE:  Awake, asking for "something to drink", slur speech at baseline per family, mentation still unable to assess. TPE completed this am.  VITAL SIGNS: Temp:  [99.1 F (37.3 C)-100.9 F (38.3 C)] 99.7 F (37.6 C) (01/27 0359) Pulse Rate:  [70-101] 100 (01/27 0200) Resp:  [15-22] 22 (01/27 0200) BP: (92-131)/(65-90) 123/82 mmHg (01/27 0200) SpO2:  [93 %-100 %] 96 % (01/27 0200) HEMODYNAMICS:   VENTILATOR SETTINGS:   INTAKE / OUTPUT: Intake/Output     01/26 0701 - 01/27 0700   Blood 12.5   Total Intake 12.5   Net +12.5         PHYSICAL EXAMINATION: General: Appears ill; alert, follows commands, oriented to name and president HEENT: [place]/AT, dry membranes, left facial droop Cardiovascular: regular rhythm, tachycardic, S1S2 present, no murmur noted  Lungs: CTAB, no wheezing Abdomen: Soft, nontender, bowel sounds diminished  Musculoskeletal: Moves 3/4 extremities spontaneously (no left arm), no edema Extremities: cold to touch left foot/leg Neuro: Encephalopathic, with L sided neglect, cough / gag present. RUE 5/5, LUE 1/5, LLE 4/5,  RLE 5/5 Skin: Petechiae in the back, and excoriations UE  LABS:  CBC  Recent Labs Lab 04/22/13 2105  WBC 6.4  HGB 11.6*  HCT 33.4*  PLT 10*   Coag's  Recent Labs Lab 04/22/13 2105  APTT 36  INR 1.11   BMET  Recent Labs Lab 04/22/13 2105  NA 139  K 4.6  CL 101  CO2 21  BUN 26*  CREATININE 2.49*  GLUCOSE 107*   Electrolytes  Recent Labs Lab 04/22/13 2105  CALCIUM 9.7  MG 2.0  PHOS 5.4*   Sepsis Markers No results found for this basename: LATICACIDVEN, PROCALCITON, O2SATVEN,  in the last 168 hours ABG No results found for this basename: PHART, PCO2ART, PO2ART,  in the last 168 hours Liver Enzymes  Recent Labs Lab 04/22/13 2105  AST 54*  ALT 28  ALKPHOS 70  BILITOT 1.9*  ALBUMIN 4.0   Cardiac Enzymes No results found for this basename: TROPONINI, PROBNP,  in the last 168 hours Glucose  Recent Labs Lab 04/23/13 0001 04/23/13 0358  GLUCAP 98 103*    Imaging Dg Chest Port 1 View  04/22/2013   CLINICAL DATA:  Left IJ dialysis catheter insertion  EXAM: PORTABLE CHEST - 1 VIEW  COMPARISON:  None.  FINDINGS: Left IJ dialysis catheter tip terminates in the mid SVC level. Normal heart size and vascularity. Lungs remain clear. No effusion or pneumothorax.  IMPRESSION: Left IJ dialysis catheter tip mid SVC level  No acute finding   Electronically Signed   By: Sharen Counter.D.  On: 04/22/2013 22:14    CXR 1/26>>> Left IJ dialysis catheter tip terminates in the mid SVC level. Normal heart size and vascularity. Lungs remain clear. No effusion or pneumothorax.  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 Hyperbilirubinemia VTE Px  P:  F/u ADAMTS-13 Trend CBC, ldh SCDs  Hematology following Continue plasmapheresis  If less 10 k , consider plat tx, will d/w heme F/u LDH No more plavix Cbc in pm   INFECTIOUS  A:  Afebrile, no leukocytosis P:  Follow cultures Monitor off abx   ENDOCRINE  A:  No active isses  P:  No intervention required   NEUROLOGIC  A:  Acute encephalopathy  Multiple previous CVAs  P:  MRI brain Neuro evaluation, no plavix, other options in future If mri pos then echo, carotids  TODAY'S SUMMARY: continue plasma x change per renal, establish when to tx plat, avoid if able, mri brain, NO plavix, neuro consult  Lewie Chamberavid Girguis, MSIV, Medical Student 04/23/2013 8:58 AM  I have personally obtained a history, examined the patient, evaluated laboratory and imaging results, formulated the assessment and plan and placed orders. CRITICAL CARE: The patient is critically ill with multiple organ systems failure and requires high complexity decision making for assessment and support, frequent evaluation and titration of therapies, application of advanced monitoring technologies and extensive interpretation of multiple databases. Critical Care Time devoted to patient care services described in this note is 30 minutes.   Mcarthur Rossettianiel J. Tyson AliasFeinstein, MD, FACP Pgr: [phone] [facility]  Pulmonary and Critical Care Medicine [facility] Pager: [phone]  04/23/2013, 6:42 AM

## 2013-04-23 NOTE — Consult Note (Addendum)
Referring Physician: Craige Cotta    Chief Complaint: CVA  HPI:                                                                                                                                         Kaitlyn Valdez is an 53 y.o. female with history of stroke that affected her RIGHT side per family member who are at bedside. Daughter states she had been talking to her on the phone between the hours of 9-10 AM and she was speaking normally.  Her aunt found patient around 41 AM down at home, and per note she was confused, showed slurred speech and lever left sided neglect.  Family at bedside state the left sided neglect is new. Patient was transferred to [facility] hospital due to PLT count 22 and concern for TTP.  TPE was initiated and patient was brought to [facility] hospital. Neurology was consulted concerning possibility of adding Plavix to TPE for embolic prevention.   Date last known well: Date: 04/21/2013 Time last known well: Time: 10:00 tPA Given: No: PLT count of 10  Past Medical History  Diagnosis Date  . CVA (cerebral vascular accident)   . Depression   . Hypertension   . Hypercholesteremia   . TTP (thrombotic thrombopenic purpura)     No past surgical history on file.  Family History  Problem Relation Age of Onset  . Hypertension Mother   . Hyperlipidemia Mother   . Hypertension Father    Social History:  has no tobacco, alcohol, and drug history on file.  Allergies:  Allergies  Allergen Reactions  . Penicillins Anaphylaxis    Medications:                                                                                                                           Prior to Admission:  Prescriptions prior to admission  Medication Sig Dispense Refill  . clonazePAM (KLONOPIN) 1 MG tablet Take 1 mg by mouth 2 (two) times daily.      . clopidogrel (PLAVIX) 75 MG tablet Take 75 mg by mouth daily with breakfast.      . desvenlafaxine (PRISTIQ) 50 MG 24 hr tablet Take 50 mg by mouth daily.       . fluticasone (FLONASE) 50 MCG/ACT nasal spray Place 2 sprays into both nostrils daily.      . folic  acid (FOLVITE) 800 MCG tablet Take 400 mcg by mouth daily.      . pravastatin (PRAVACHOL) 40 MG tablet Take 40 mg by mouth daily.      . valsartan (DIOVAN) 40 MG tablet Take 40 mg by mouth daily.      . Vortioxetine HBr (BRINTELLIX) 20 MG TABS Take 20 mg by mouth daily.       Scheduled: . calcium gluconate  2 g Intravenous Once  . pantoprazole (PROTONIX) IV  40 mg Intravenous Q24H   Continuous: . sodium chloride 1,000 mL (04/23/13 0941)  . citrate dextrose      ROS:                                                                                                                                       History obtained from daughter  General ROS: negative for - chills, fatigue, fever, night sweats, weight gain or weight loss Psychological ROS: negative for - behavioral disorder, hallucinations, memory difficulties, mood swings or suicidal ideation Ophthalmic ROS: negative for - blurry vision, double vision, eye pain or loss of vision ENT ROS: negative for - epistaxis, nasal discharge, oral lesions, sore throat, tinnitus or vertigo Allergy and Immunology ROS: negative for - hives or itchy/watery eyes Hematological and Lymphatic ROS: negative for - bleeding problems, bruising or swollen lymph nodes Endocrine ROS: negative for - galactorrhea, hair pattern changes, polydipsia/polyuria or temperature intolerance Respiratory ROS: negative for - cough, hemoptysis, shortness of breath or wheezing Cardiovascular ROS: negative for - chest pain, dyspnea on exertion, edema or irregular heartbeat Gastrointestinal ROS: negative for - abdominal pain, diarrhea, hematemesis, nausea/vomiting or stool incontinence Genito-Urinary ROS: negative for - dysuria, hematuria, incontinence or urinary frequency/urgency Musculoskeletal ROS: negative for - joint swelling or muscular weakness Neurological ROS: as noted in  HPI Dermatological ROS: negative for rash and skin lesion changes  Neurologic Examination:                                                                                                      Blood pressure 104/67, pulse 89, temperature 100 F (37.8 C), temperature source Oral, resp. rate 17, weight 57 kg (125 lb 10.6 oz), SpO2 99.00%.   Mental Status: Alert, not oriented perseverating on need to urinate and desire to go to the bathroom.  Speech dysarthric without evidence of aphasia.  Able to follow simple commands. Cranial Nerves: II: Discs flat bilaterally; Visual fields dense left hemianopsia, pupils equal, round, reactive to light and  accommodation III,IV, VI: ptosis not present, preference to look to the right but able to cross midline and extra-ocular motions intact bilaterally V,VII: smile asymmetric on the left, facial light touch sensation normal bilaterally VIII: hearing normal bilaterally IX,X: gag reflex present XI: bilateral shoulder shrug XII: midline tongue extension without atrophy or fasciculations  Motor: Right : Upper extremity   5/5    Left:     Upper extremity   0/5  Lower extremity   5/5     Lower extremity   3/5 Tone and bulk:normal tone throughout; no atrophy noted Sensory: Pinprick and light touch intact throughout but neglects the right arm and leg to DSS.   Deep Tendon Reflexes:  Right: Upper Extremity   Left: Upper extremity   biceps (C-5 to C-6) 2/4   biceps (C-5 to C-6) 2/4 tricep (C7) 2/4    triceps (C7) 2/4 Brachioradialis (C6) 2/4  Brachioradialis (C6) 2/4  Lower Extremity Lower Extremity  quadriceps (L-2 to L-4) 2/4   quadriceps (L-2 to L-4) 3/4 Achilles (S1) 2/4   Achilles (S1) 4/4  Plantars: Right: downgoing   Left: downgoing Cerebellar: normal finger-to-nose on the right,  Unable to get patient to perform heel-to-shin test Gait: not teested CV: pulses palpable throughout    Lab Results: Basic Metabolic Panel:  Recent Labs Lab  04/22/13 2105  NA 139  K 4.6  CL 101  CO2 21  GLUCOSE 107*  BUN 26*  CREATININE 2.49*  CALCIUM 9.7  MG 2.0  PHOS 5.4*    Liver Function Tests:  Recent Labs Lab 04/22/13 2105  AST 54*  ALT 28  ALKPHOS 70  BILITOT 1.9*  PROT 7.4  ALBUMIN 4.0   No results found for this basename: LIPASE, AMYLASE,  in the last 168 hours No results found for this basename: AMMONIA,  in the last 168 hours  CBC:  Recent Labs Lab 04/22/13 2105  WBC 6.4  NEUTROABS 3.8  HGB 11.6*  HCT 33.4*  MCV 91.0  PLT 10*    Cardiac Enzymes: No results found for this basename: CKTOTAL, CKMB, CKMBINDEX, TROPONINI,  in the last 168 hours  Lipid Panel: No results found for this basename: CHOL, TRIG, HDL, CHOLHDL, VLDL, LDLCALC,  in the last 168 hours  CBG:  Recent Labs Lab 04/23/13 0001 04/23/13 0358 04/23/13 0815 04/23/13 1153  GLUCAP 98 103* 165* 103*    Microbiology: Results for orders placed during the hospital encounter of 04/22/13  MRSA PCR SCREENING     Status: None   Collection Time    04/22/13  7:04 PM      Result Value Range Status   MRSA by PCR NEGATIVE  NEGATIVE Final   Comment:            The GeneXpert MRSA Assay (FDA     approved for NASAL specimens     only), is one component of a     comprehensive MRSA colonization     surveillance program. It is not     intended to diagnose MRSA     infection nor to guide or     monitor treatment for     MRSA infections.    Coagulation Studies:  Recent Labs  04/22/13 2105  LABPROT 14.1  INR 1.11    Imaging: Dg Chest Port 1 View  04/22/2013   CLINICAL DATA:  Left IJ dialysis catheter insertion  EXAM: PORTABLE CHEST - 1 VIEW  COMPARISON:  None.  FINDINGS: Left IJ dialysis catheter tip terminates  in the mid SVC level. Normal heart size and vascularity. Lungs remain clear. No effusion or pneumothorax.  IMPRESSION: Left IJ dialysis catheter tip mid SVC level  No acute finding   Electronically Signed   By: Ruel Favors M.D.    On: 04/22/2013 22:14    Assessment and plan discussed with with attending physician and they are in agreement.    Felicie Morn PA-C [facility] [phone]  04/23/2013, 1:05 PM   Assessment: 53 y.o. female [facility] after being found down at home with new left arm and leg weakness and slurred speech.  Patient currently being treated with TPE for TTP and PLT of 10.  Given her low platelets, I would not add any antiplatelet medications. Given degree of edema at 24 hours, may benefit from hyperosmolar therapy and will start that at this time.    Stroke Risk Factors - hyperlipidemia and hypertension  1) Agree with PLEX for TTP.  2) avoid antiplatelets given risk of bleeding 3) hypertonic saline 4) repeat head CT for any worsening.  5) Frequent neuro checks 6) MRI/MRA when feasible   Ritta Slot, MD [facility] [phone]  If 7pm- 7am, please page neurology on call at [phone].

## 2013-04-24 ENCOUNTER — Inpatient Hospital Stay (HOSPITAL_COMMUNITY): Payer: Medicare HMO

## 2013-04-24 DIAGNOSIS — I6350 Cerebral infarction due to unspecified occlusion or stenosis of unspecified cerebral artery: Secondary | ICD-10-CM

## 2013-04-24 DIAGNOSIS — E871 Hypo-osmolality and hyponatremia: Secondary | ICD-10-CM

## 2013-04-24 LAB — THERAPEUTIC PLASMA EXCHANGE (BLOOD BANK)
Plasma Exchange: 3950
UNIT DIVISION: 0
UNIT DIVISION: 0
UNIT DIVISION: 0
UNIT DIVISION: 0
UNIT DIVISION: 0
UNIT DIVISION: 0
Unit division: 0
Unit division: 0
Unit division: 0
Unit division: 0
Unit division: 0
Unit division: 0
Unit division: 0
Unit division: 0
Unit division: 0
Unit division: 0
Unit division: 0

## 2013-04-24 LAB — CBC
HCT: 21.4 % — ABNORMAL LOW (ref 36.0–46.0)
HCT: 20.8 % — ABNORMAL LOW (ref 36.0–46.0)
HCT: 23.6 % — ABNORMAL LOW (ref 36.0–46.0)
HEMATOCRIT: 23.5 % — AB (ref 36.0–46.0)
HEMOGLOBIN: 7.1 g/dL — AB (ref 12.0–15.0)
Hemoglobin: 7.3 g/dL — ABNORMAL LOW (ref 12.0–15.0)
Hemoglobin: 7.9 g/dL — ABNORMAL LOW (ref 12.0–15.0)
Hemoglobin: 8 g/dL — ABNORMAL LOW (ref 12.0–15.0)
MCH: 31.3 pg (ref 26.0–34.0)
MCH: 31.1 pg (ref 26.0–34.0)
MCH: 30.7 pg (ref 26.0–34.0)
MCH: 31.1 pg (ref 26.0–34.0)
MCHC: 34.1 g/dL (ref 30.0–36.0)
MCHC: 34.1 g/dL (ref 30.0–36.0)
MCHC: 33.6 g/dL (ref 30.0–36.0)
MCHC: 33.9 g/dL (ref 30.0–36.0)
MCV: 91.8 fL (ref 78.0–100.0)
MCV: 91.2 fL (ref 78.0–100.0)
MCV: 91.4 fL (ref 78.0–100.0)
MCV: 91.8 fL (ref 78.0–100.0)
PLATELETS: 33 10*3/uL — AB (ref 150–400)
PLATELETS: 49 10*3/uL — AB (ref 150–400)
Platelets: 40 10*3/uL — ABNORMAL LOW (ref 150–400)
Platelets: 36 10*3/uL — ABNORMAL LOW (ref 150–400)
RBC: 2.33 MIL/uL — AB (ref 3.87–5.11)
RBC: 2.28 MIL/uL — ABNORMAL LOW (ref 3.87–5.11)
RBC: 2.57 MIL/uL — AB (ref 3.87–5.11)
RBC: 2.57 MIL/uL — ABNORMAL LOW (ref 3.87–5.11)
RDW: 14.6 % (ref 11.5–15.5)
RDW: 14.7 % (ref 11.5–15.5)
RDW: 15.8 % — ABNORMAL HIGH (ref 11.5–15.5)
RDW: 16.6 % — AB (ref 11.5–15.5)
WBC: 4.9 10*3/uL (ref 4.0–10.5)
WBC: 4.4 10*3/uL (ref 4.0–10.5)
WBC: 5.2 10*3/uL (ref 4.0–10.5)
WBC: 6.5 10*3/uL (ref 4.0–10.5)

## 2013-04-24 LAB — LIPID PANEL
Cholesterol: 122 mg/dL (ref 0–200)
HDL: 37 mg/dL — ABNORMAL LOW (ref >39)
LDL Cholesterol: 65 mg/dL (ref 0–99)
Total CHOL/HDL Ratio: 3.3 RATIO
Triglycerides: 99 mg/dL (ref <150)
VLDL: 20 mg/dL (ref 0–40)

## 2013-04-24 LAB — URINE CULTURE
COLONY COUNT: NO GROWTH
CULTURE: NO GROWTH

## 2013-04-24 LAB — POCT I-STAT, CHEM 8
BUN: 26 mg/dL — ABNORMAL HIGH (ref 6–23)
CHLORIDE: 116 meq/L — AB (ref 96–112)
Calcium, Ion: 1.14 mmol/L (ref 1.12–1.23)
Creatinine, Ser: 2.4 mg/dL — ABNORMAL HIGH (ref 0.50–1.10)
Glucose, Bld: 111 mg/dL — ABNORMAL HIGH (ref 70–99)
HEMATOCRIT: 19 % — AB (ref 36.0–46.0)
HEMOGLOBIN: 6.5 g/dL — AB (ref 12.0–15.0)
Potassium: 3.3 mEq/L — ABNORMAL LOW (ref 3.7–5.3)
Sodium: 155 mEq/L — ABNORMAL HIGH (ref 137–147)
TCO2: 24 mmol/L (ref 0–100)

## 2013-04-24 LAB — GLUCOSE, CAPILLARY
GLUCOSE-CAPILLARY: 105 mg/dL — AB (ref 70–99)
GLUCOSE-CAPILLARY: 121 mg/dL — AB (ref 70–99)
GLUCOSE-CAPILLARY: 171 mg/dL — AB (ref 70–99)
Glucose-Capillary: 124 mg/dL — ABNORMAL HIGH (ref 70–99)
Glucose-Capillary: 180 mg/dL — ABNORMAL HIGH (ref 70–99)
Glucose-Capillary: 89 mg/dL (ref 70–99)

## 2013-04-24 LAB — RENAL FUNCTION PANEL
Albumin: 3.1 g/dL — ABNORMAL LOW (ref 3.5–5.2)
BUN: 28 mg/dL — ABNORMAL HIGH (ref 6–23)
CALCIUM: 8.3 mg/dL — AB (ref 8.4–10.5)
CO2: 25 mEq/L (ref 19–32)
CREATININE: 2.38 mg/dL — AB (ref 0.50–1.10)
Chloride: 117 mEq/L — ABNORMAL HIGH (ref 96–112)
GFR calc Af Amer: 25 mL/min — ABNORMAL LOW (ref >90)
GFR calc non Af Amer: 22 mL/min — ABNORMAL LOW (ref >90)
Glucose, Bld: 107 mg/dL — ABNORMAL HIGH (ref 70–99)
PHOSPHORUS: 4 mg/dL (ref 2.3–4.6)
Potassium: 3.8 mEq/L (ref 3.7–5.3)
Sodium: 155 mEq/L — ABNORMAL HIGH (ref 137–147)

## 2013-04-24 LAB — LACTATE DEHYDROGENASE: LDH: 301 U/L — ABNORMAL HIGH (ref 94–250)

## 2013-04-24 LAB — SODIUM
SODIUM: 153 meq/L — AB (ref 137–147)
SODIUM: 157 meq/L — AB (ref 137–147)

## 2013-04-24 LAB — PREPARE RBC (CROSSMATCH)

## 2013-04-24 MED ORDER — ACD FORMULA A 0.73-2.45-2.2 GM/100ML VI SOLN
Status: AC
  Administered 2013-04-24: 500 mL via INTRAVENOUS
  Filled 2013-04-24: qty 500

## 2013-04-24 MED ORDER — CALCIUM CARBONATE ANTACID 500 MG PO CHEW
2.0000 | CHEWABLE_TABLET | ORAL | Status: AC
  Filled 2013-04-24 (×3): qty 2

## 2013-04-24 MED ORDER — ATROPINE SULFATE 0.1 MG/ML IJ SOLN
INTRAMUSCULAR | Status: AC
  Filled 2013-04-24: qty 10

## 2013-04-24 MED ORDER — ACD FORMULA A 0.73-2.45-2.2 GM/100ML VI SOLN
500.0000 mL | Status: DC
  Administered 2013-04-24: 500 mL via INTRAVENOUS
  Filled 2013-04-24: qty 500

## 2013-04-24 MED ORDER — DIPHENHYDRAMINE HCL 25 MG PO CAPS: 25.0000 mg | ORAL_CAPSULE | Freq: Four times a day (QID) | ORAL | Status: DC | PRN

## 2013-04-24 MED ORDER — SODIUM CHLORIDE 0.9 % IV SOLN
4.0000 g | Freq: Once | INTRAVENOUS | Status: AC
  Administered 2013-04-24: 4 g via INTRAVENOUS
  Filled 2013-04-24: qty 40

## 2013-04-24 MED ORDER — LORAZEPAM 2 MG/ML IJ SOLN: 1.0000 mg | Freq: Once | INTRAMUSCULAR | Status: DC | PRN

## 2013-04-24 MED ORDER — STARCH (THICKENING) PO POWD: ORAL | Status: DC | PRN

## 2013-04-24 MED ORDER — RESOURCE THICKENUP CLEAR PO POWD
ORAL | Status: DC | PRN
  Filled 2013-04-24: qty 125

## 2013-04-24 MED ORDER — ACETAMINOPHEN 325 MG PO TABS
650.0000 mg | ORAL_TABLET | ORAL | Status: DC | PRN
Start: 2013-04-24 — End: 2013-04-25

## 2013-04-24 MED ORDER — ANTICOAGULANT SODIUM CITRATE 4% (200MG/5ML) IV SOLN
5.0000 mL | Freq: Once | Status: AC
  Administered 2013-04-24: 5 mL
  Filled 2013-04-24: qty 250

## 2013-04-24 NOTE — Progress Notes (Signed)
[facility] Progress Note   Subjective:   Pt on plasmapheresis currently, denies any pain.   Neuro gave hypertonic saline to induce hypernatremia per their protocol for stroke purposes   Objective:   BP 121/76  Pulse 100  Temp(Src) 98.3 F (36.8 C) (Axillary)  Resp 14  Wt 125 lb 10.6 oz (57 kg)  SpO2 100%  Intake/Output Summary (Last 24 hours) at 04/24/13 0719 Last data filed at 04/24/13 0600  Gross per 24 hour  Intake 1962.5 ml  Output   1275 ml  Net  687.5 ml   Weight change:   Physical Exam: Gen:NAD, in bed Cardio: RRR, no murmurs appreciated  Resp:CTAB, no wheezing RUE:AVWU/JW/JXAbd:Soft/NT/ND, no CVA tendernes Ext: no edema B/L  Neuro:  Awake, alert, not oriented, dysarthria w/ R sided neglect Zettie Phobel to recognize that she was getting PLEX and asked about her platelet count  Imaging: Ct Head Wo Contrast  04/23/2013   CLINICAL DATA:  53 year old female with hypertension and hypercholesterolemia down down with new left arm and leg weakness with slurred speech. TTP with platelets of 10.  EXAM: CT HEAD WITHOUT CONTRAST  TECHNIQUE: Contiguous axial images were obtained from the base of the skull through the vertex without intravenous contrast.  COMPARISON:  None.  FINDINGS: Exam is motion degraded.  Large nonhemorrhagic acute right middle cerebral artery distribution infarct involving portions of the right temporal lobe, right operculum region, right subinsular region, right frontal lobe and right parietal lobe. Local mass effect with slight compression of the right lateral ventricle without midline shift.  Remote infarct with encephalomalacia left posterior temporal -parietal lobe.  Baseline atrophy without hydrocephalus.  No intracranial mass lesion noted on this unenhanced exam.  Mild mucosal thickening paranasal sinuses.  IMPRESSION: Exam is motion degraded.  Large nonhemorrhagic acute right middle cerebral artery distribution infarct with slight compression of the right lateral  ventricle without midline shift. Patient is at risk for development of further mass effect/hemorrhage as the infarct evolves.  Remote infarct with encephalomalacia left posterior temporal -parietal lobe.  These results were called by telephone at the time of interpretation on 04/23/2013 at 3:16 PM to Dr. Amada JupiterKirkpatrick who verbally acknowledged these results.   Electronically Signed   By: Bridgett LarssonSteve  Olson M.D.   On: 04/23/2013 15:19   [place] Renal Port  04/23/2013   CLINICAL DATA:  Acute renal insufficiency  EXAM: RENAL/URINARY TRACT ULTRASOUND COMPLETE  COMPARISON:  None  FINDINGS: Right Kidney:  Length: 9.8 cm. Normal cortical thickness. Increased cortical echogenicity. No mass, hydronephrosis or shadowing calcification. Probable tiny amount of fluid adjacent to the upper pole of the left kidney. .  Left Kidney:  Length: 12.8 cm. Normal cortical thickness. Increased cortical echogenicity. No mass, hydronephrosis shadowing calcification. No perinephric fluid collection.  Bladder:  Appears normal for degree of bladder distention.  IMPRESSION: Increased renal cortical echogenicity bilaterally suggesting medical renal disease.  No evidence of renal mass or hydronephrosis.  Probable tiny amount of fluid adjacent to the upper pole of the left kidney, nonspecific.   Electronically Signed   By: Ulyses SouthwardMark  Boles M.D.   On: 04/23/2013 13:57   Dg Chest Port 1 View  04/22/2013   CLINICAL DATA:  Left IJ dialysis catheter insertion  EXAM: PORTABLE CHEST - 1 VIEW  COMPARISON:  None.  FINDINGS: Left IJ dialysis catheter tip terminates in the mid SVC level. Normal heart size and vascularity. Lungs remain clear. No effusion or pneumothorax.  IMPRESSION: Left IJ dialysis catheter tip mid SVC level  No acute finding   Electronically Signed   By: Ruel Favors M.D.   On: 04/22/2013 22:14    Labs: BMET  Recent Labs Lab 04/22/13 2105 04/23/13 0656 04/23/13 1541 04/23/13 2100 04/24/13 0313  NA 139 142 144 149* 153*  155*  K 4.6 3.9  3.5*  --  3.8  CL 101 104 102  --  117*  CO2 21  --  29  --  25  GLUCOSE 107* 102* 91  --  107*  BUN 26* 33* 30*  --  28*  CREATININE 2.49* 3.00* 2.67*  --  2.38*  CALCIUM 9.7  --  8.9  --  8.3*  PHOS 5.4*  --  4.4  --  4.0   CBC  Recent Labs Lab 04/22/13 2105 04/23/13 0656 04/24/13 0313  WBC 6.4  --  4.9  NEUTROABS 3.8  --   --   HGB 11.6* 9.5* 7.3*  HCT 33.4* 28.0* 21.4*  MCV 91.0  --  91.8  PLT 10*  --  33*    Medications:    . anticoagulant sodium citrate  5 mL Intracatheter Once  . calcium carbonate  2 tablet Oral Q3H  . calcium gluconate IVPB  4 g Intravenous Once  . calcium gluconate  2 g Intravenous Once  . citrate dextrose      . pantoprazole (PROTONIX) IV  40 mg Intravenous Q24H      Assessment/ Plan:   Pt is a 53 y/o FM w/ PMHx of CVA w/ baseline R sided neglect found down at home with confusion and slurred speech w/ thrombocytopenia, hemolytic anemia, AKI, fever, and confusion dx with TTP.  1) TTP with AKI - Pt with pentad consistent with TTP secondary to most likely Plavix. ADAMTS 13 pending. LDH trending down.  NO MORE PLAVIX.  2) H/O of CVA with R sided Neglect 3) HTN 4) Hyperlipidemia  5) Hypernatremia - intentional per neuro using hypertonic saline so will defer further mgmt to them  Plan: 1) Continue Plasmaspheresis as needed would like for heme to take over writing orders for this as really not a renal indication and further management/maintenance will need to be determined by them anyway. 2) Trend daily creatinine, I/O, BP's 3) MRI/MRA when stable  4) F/U CBC for platelets, LDH, and ADAMTS13 5) Have not written to correct hypernatremia as this was intentionally induced by neuro so defer to them  Briscoe Deutscher, DO PGY-2, [facility] 04/24/2013, 7:19 AM

## 2013-04-24 NOTE — Progress Notes (Signed)
Name: Kaitlyn Valdez MRN: [id number] DOB: 21-Apr-1960    ADMISSION DATE:  04/22/2013 CONSULTATION DATE: 04/22/2013   REFERRING MD : [facility] EDP   PRIMARY SERVICE: PCCM   CHIEF COMPLAINT: Acute encephalopathy   BRIEF PATIENT DESCRIPTION: 53 yo with past medical history of CVA and baseline L sided weakness found down at home, covered in emesis, confused with slurred speech and worsened L sided weakness. Workup revealed thrombocytopenia. Head CT revealed no acute findings but multiple prior infarcts. Transferred to [facility] for plasma phoresis. Repeat CT head at [facility] revealed large nonhemorrhagic acute R MCA infarct.   SIGNIFICANT EVENTS / STUDIES:  1/26 Found down, low platelets  1/26 Head CT [facility]) >>> no acute abnormality, multiple old infarcts  1/26 Transferred to [facility] for plasmapheresis  1/27 Renal u/s - No masses / hydro. Medical renal disease 1/27 CT head - Large nonhemorrhagic acute right middle cerebral artery distribution infarct with slight compression of the right lateral ventricle without midline shift. Patient is at risk for development of further mass effect/hemorrhage as the infarct evolves.  LINES / TUBES:  Trialysis catheter 1/26 >>>  PIV 1/26 >>>  Foley 1/27>>>  CULTURES:  1/26 Blood >>>  1/26 Urine >>>   ANTIBIOTICS:  none   SUBJECTIVE:  Agitated overnight, trying to get OOB. Given waist belt, 2mg  haldol x 3 doses. Started on hypertonic saline for increased edema s/p CT head.  VITAL SIGNS: Temp:  [97.8 F (36.6 C)-100.1 F (37.8 C)] 98.3 F (36.8 C) (01/28 0434) Pulse Rate:  [68-121] 100 (01/28 0600) Resp:  [14-26] 14 (01/28 0600) BP: (93-151)/(50-107) 121/76 mmHg (01/28 0600) SpO2:  [93 %-100 %] 100 % (01/28 0600) HEMODYNAMICS:   VENTILATOR SETTINGS:   INTAKE / OUTPUT: Intake/Output     01/27 0701 - 01/28 0700   I.V. (mL/kg) 1962.5 (34.4)   Total Intake(mL/kg) 1962.5 (34.4)   Urine (mL/kg/hr) 1275 (0.9)   Total Output 1275   Net +687.5          PHYSICAL EXAMINATION: General: Alert, follows simple commands, oriented to name and president  HEENT: [place]/AT, dry membranes, left facial droop  Cardiovascular: regular rhythm, tachycardic, S1S2 present, no murmur noted  Lungs: CTAB, no wheezing  Abdomen: Soft, nontender, bowel sounds diminished  Musculoskeletal: Moves 3/4 extremities spontaneously (no left arm), no edema  Extremities: cold to touch left foot/leg  Neuro: Encephalopathic, with L sided neglect, cough / gag present. RUE 5/5, LUE 0/5, LLE 4/5, RLE 5/5  Skin: Petechiae in the back, and excoriations UE  LABS:  CBC  Recent Labs Lab 04/22/13 2105 04/23/13 0656 04/24/13 0313  WBC 6.4  --  4.9  HGB 11.6* 9.5* 7.3*  HCT 33.4* 28.0* 21.4*  PLT 10*  --  33*   Coag's  Recent Labs Lab 04/22/13 2105  APTT 36  INR 1.11   BMET  Recent Labs Lab 04/22/13 2105 04/23/13 0656 04/23/13 1541 04/23/13 2100 04/24/13 0313  NA 139 142 144 149* 153*  155*  K 4.6 3.9 3.5*  --  3.8  CL 101 104 102  --  117*  CO2 21  --  29  --  25  BUN 26* 33* 30*  --  28*  CREATININE 2.49* 3.00* 2.67*  --  2.38*  GLUCOSE 107* 102* 91  --  107*   Electrolytes  Recent Labs Lab 04/22/13 2105 04/23/13 1541 04/24/13 0313  CALCIUM 9.7 8.9 8.3*  MG 2.0  --   --   PHOS 5.4* 4.4 4.0  Sepsis Markers No results found for this basename: LATICACIDVEN, PROCALCITON, O2SATVEN,  in the last 168 hours ABG No results found for this basename: PHART, PCO2ART, PO2ART,  in the last 168 hours Liver Enzymes  Recent Labs Lab 04/22/13 2105 04/23/13 1541 04/24/13 0313  AST 54*  --   --   ALT 28  --   --   ALKPHOS 70  --   --   BILITOT 1.9*  --   --   ALBUMIN 4.0 3.3* 3.1*   Cardiac Enzymes No results found for this basename: TROPONINI, PROBNP,  in the last 168 hours Glucose  Recent Labs Lab 04/23/13 0815 04/23/13 1153 04/23/13 1543 04/23/13 1854 04/23/13 2356 04/24/13 0355  GLUCAP 165* 103* 95 93 89 105*    Imaging Ct Head Wo  Contrast  04/23/2013   CLINICAL DATA:  53 year old female with hypertension and hypercholesterolemia down down with new left arm and leg weakness with slurred speech. TTP with platelets of 10.  EXAM: CT HEAD WITHOUT CONTRAST  TECHNIQUE: Contiguous axial images were obtained from the base of the skull through the vertex without intravenous contrast.  COMPARISON:  None.  FINDINGS: Exam is motion degraded.  Large nonhemorrhagic acute right middle cerebral artery distribution infarct involving portions of the right temporal lobe, right operculum region, right subinsular region, right frontal lobe and right parietal lobe. Local mass effect with slight compression of the right lateral ventricle without midline shift.  Remote infarct with encephalomalacia left posterior temporal -parietal lobe.  Baseline atrophy without hydrocephalus.  No intracranial mass lesion noted on this unenhanced exam.  Mild mucosal thickening paranasal sinuses.  IMPRESSION: Exam is motion degraded.  Large nonhemorrhagic acute right middle cerebral artery distribution infarct with slight compression of the right lateral ventricle without midline shift. Patient is at risk for development of further mass effect/hemorrhage as the infarct evolves.  Remote infarct with encephalomalacia left posterior temporal -parietal lobe.  These results were called by telephone at the time of interpretation on 04/23/2013 at 3:16 PM to Dr. Amada JupiterKirkpatrick who verbally acknowledged these results.   Electronically Signed   By: Bridgett LarssonSteve  Olson M.D.   On: 04/23/2013 15:19   [place] Renal Port  04/23/2013   CLINICAL DATA:  Acute renal insufficiency  EXAM: RENAL/URINARY TRACT ULTRASOUND COMPLETE  COMPARISON:  None  FINDINGS: Right Kidney:  Length: 9.8 cm. Normal cortical thickness. Increased cortical echogenicity. No mass, hydronephrosis or shadowing calcification. Probable tiny amount of fluid adjacent to the upper pole of the left kidney. .  Left Kidney:  Length: 12.8 cm. Normal  cortical thickness. Increased cortical echogenicity. No mass, hydronephrosis shadowing calcification. No perinephric fluid collection.  Bladder:  Appears normal for degree of bladder distention.  IMPRESSION: Increased renal cortical echogenicity bilaterally suggesting medical renal disease.  No evidence of renal mass or hydronephrosis.  Probable tiny amount of fluid adjacent to the upper pole of the left kidney, nonspecific.   Electronically Signed   By: Ulyses SouthwardMark  Boles M.D.   On: 04/23/2013 13:57   Dg Chest Port 1 View  04/22/2013   CLINICAL DATA:  Left IJ dialysis catheter insertion  EXAM: PORTABLE CHEST - 1 VIEW  COMPARISON:  None.  FINDINGS: Left IJ dialysis catheter tip terminates in the mid SVC level. Normal heart size and vascularity. Lungs remain clear. No effusion or pneumothorax.  IMPRESSION: Left IJ dialysis catheter tip mid SVC level  No acute finding   Electronically Signed   By: Ruel Favorsrevor  Shick M.D.   On: 04/22/2013 22:14  CXR 1/28 - no defined infiltrate  ASSESSMENT / PLAN:   NEUROLOGIC  A:  Acute R MCA infarct - L sided neglect Acute encephalopathy  Multiple previous CVAs  P:  MRI brain if possible, unable today Neuro following Hypertonic saline now off Recheck BMP this afternoon for NA , allow to drop on own For CT head in am  As unable to mri brain Echo Carotid ecg   PULMONARY  A:   No respiratory distress P:  Goal SpO2>92  Supplemental oxygen as needed May require ABG  CARDIOVASCULAR  A:  Hx HTN  Hx dyslipidemia  P:  Goal MAP>60  Hold Valsartan / Pravachol  Plavix stopped indefinitely  Monitor tele Echo for clot ecg  RENAL  A:  Renal failure (unknown baseline) ?Acute on chronic --trending down Renal u/s - no mass / hydro Hyperphosphatemia - corrected Hypernatremia Hyperchloremia P:  Trend BMP  Hypertonic saline - see neuro Permissive hyper Na and Cl for now No free water Nephrology following For daily plasma xhchange per heme, d/w  heme  GASTROINTESTINAL  A:  Nutrition  GI Px  Presume dyspagia  P:  NPO for now Plat improved to almost 50 k , add NGT Protonix for GI Px  SLP pending  HEMATOLOGIC  A:  Suspected TTP - no more plavix Anemia - downtrending Hgb, asymptomatic Hyperbilirubinemia  VTE Px  LDH trending down Would ensure no heparin exposure, No HITT with associated cva P:  F/u ADAMTS-13 - pending, may take 5 days Trend CBC SCDs  Hematology following, the will direct plasma xchange Continue plasmapheresis No more plavix  Active hemolysis = bleeding per [facility], tx 1 unit prbc While we await info on hosp admissions, assess HITT   INFECTIOUS  A: Low grade fever  No leukocytosis  P:  Follow cultures  Trend fever curve  ENDOCRINE  A:  No active isses  P:  No intervention required  TODAY'S SUMMARY: MRI if able, ct in am , stroke recs followed, dc 3%, echo, carotids, statr TF  I have personally obtained a history, examined the patient, evaluated laboratory and imaging results, formulated the assessment and plan and placed orders. CRITICAL CARE: The patient is critically ill with multiple organ systems failure and requires high complexity decision making for assessment and support, frequent evaluation and titration of therapies, application of advanced monitoring technologies and extensive interpretation of multiple databases. Critical Care Time devoted to patient care services described in this note is 30 minutes.   Mcarthur Rossetti. Tyson Alias, MD, FACP Pgr: [phone] [facility]  Pulmonary and Critical Care Medicine [facility] Pager: [phone]  04/24/2013, 6:48 AM

## 2013-04-24 NOTE — Procedures (Signed)
I have personally attended this patient's TPE.  1.5 plasma volumes with plasma replacement. Plts up to 30K.    Kaitlyn Valdez

## 2013-04-24 NOTE — Progress Notes (Signed)
Stroke Team Progress Note  HISTORY Kaitlyn Valdez is an 53 y.o. female with history of stroke that affected her RIGHT side per family member who are at bedside. Daughter states she had been talking to her on the phone between the hours of 9-10 AM and she was speaking normally. Her aunt found patient around 27 AM 04/21/2013 down at home, and per note she was confused, showed slurred speech and lever left sided neglect. Family at bedside state the  left sided neglect is new. Patient was transferred to [facility] hospital from [facility] due to PLT count 22 and concern for TTP. TPE was initiated and patient was brought to [facility] hospital. Neurology was consulted concerning possibility of adding Plavix to TPE for embolic prevention.  Patient was not administerd TPA secondary to PLT count of 10. She was admitted to [facility] for further evaluation and treatment.  SUBJECTIVE Her RN is at the bedside, her sister arrived during rounds.   OBJECTIVE Most recent Vital Signs: Filed Vitals:   04/24/13 0910 04/24/13 0916 04/24/13 0920 04/24/13 0925  BP: 101/71 104/82 109/97 120/67  Pulse: 104 106 105 107  Temp: 99.8 F (37.7 C) 99.2 F (37.3 C) 99.5 F (37.5 C) 99.4 F (37.4 C)  TempSrc: Oral Oral Oral Oral  Resp: 24 20 22 23   Weight:      SpO2: 100% 100% 100% 99%   CBG (last 3)   Recent Labs  04/23/13 2356 04/24/13 0355 04/24/13 0806  GLUCAP 89 105* 124*    IV Fluid Intake:   . citrate dextrose    . citrate dextrose    . sodium chloride (hypertonic) 37.5 mL/hr (04/24/13 0747)    MEDICATIONS  . anticoagulant sodium citrate  5 mL Intracatheter Once  . calcium carbonate  2 tablet Oral Q3H  . calcium gluconate IVPB  4 g Intravenous Once  . calcium gluconate  2 g Intravenous Once  . pantoprazole (PROTONIX) IV  40 mg Intravenous Q24H   PRN:  acetaminophen, acetaminophen, acetaminophen, diphenhydrAMINE, diphenhydrAMINE, diphenhydrAMINE, haloperidol lactate, LORazepam, ondansetron (ZOFRAN) IV  Diet:  NPO   Activity:  Bedrest DVT Prophylaxis:  SCDs   CLINICALLY SIGNIFICANT STUDIES Basic Metabolic Panel:  Recent Labs Lab 04/22/13 2105  04/23/13 1541  04/24/13 0313 04/24/13 0759  NA 139  < > 144  < > 153*  155* 157*  K 4.6  < > 3.5*  --  3.8  --   CL 101  < > 102  --  117*  --   CO2 21  --  29  --  25  --   GLUCOSE 107*  < > 91  --  107*  --   BUN 26*  < > 30*  --  28*  --   CREATININE 2.49*  < > 2.67*  --  2.38*  --   CALCIUM 9.7  --  8.9  --  8.3*  --   MG 2.0  --   --   --   --   --   PHOS 5.4*  --  4.4  --  4.0  --   < > = values in this interval not displayed. Liver Function Tests:  Recent Labs Lab 04/22/13 2105 04/23/13 1541 04/24/13 0313  AST 54*  --   --   ALT 28  --   --   ALKPHOS 70  --   --   BILITOT 1.9*  --   --   PROT 7.4  --   --  ALBUMIN 4.0 3.3* 3.1*   CBC:  Recent Labs Lab 04/22/13 2105  04/24/13 0313 04/24/13 0759  WBC 6.4  --  4.9 4.4  NEUTROABS 3.8  --   --   --   HGB 11.6*  < > 7.3* 7.1*  HCT 33.4*  < > 21.4* 20.8*  MCV 91.0  --  91.8 91.2  PLT 10*  --  33* 40*  < > = values in this interval not displayed. Coagulation:  Recent Labs Lab 04/22/13 2105  LABPROT 14.1  INR 1.11   Cardiac Enzymes: No results found for this basename: CKTOTAL, CKMB, CKMBINDEX, TROPONINI,  in the last 168 hours Urinalysis:  Recent Labs Lab 04/23/13 1157  COLORURINE AMBER*  LABSPEC 1.021  PHURINE 6.5  GLUCOSEU NEGATIVE  HGBUR LARGE*  BILIRUBINUR SMALL*  KETONESUR 15*  PROTEINUR >300*  UROBILINOGEN 1.0  NITRITE NEGATIVE  LEUKOCYTESUR NEGATIVE   Lipid Panel    Component Value Date/Time   CHOL 122 04/24/2013 0313   TRIG 99 04/24/2013 0313   HDL 37* 04/24/2013 0313   CHOLHDL 3.3 04/24/2013 0313   VLDL 20 04/24/2013 0313   LDLCALC 65 04/24/2013 0313   HgbA1C  Lab Results  Component Value Date   HGBA1C 4.8 04/23/2013    Urine Drug Screen:   No results found for this basename: labopia, cocainscrnur, labbenz, amphetmu, thcu, labbarb    Alcohol  Level: No results found for this basename: ETH,  in the last 168 hours  [place] Renal Port  04/23/2013    Increased renal cortical echogenicity bilaterally suggesting medical renal disease.  No evidence of renal mass or hydronephrosis.  Probable tiny amount of fluid adjacent to the upper pole of the left kidney, nonspecific.    CT of the brain  04/23/2013  Exam is motion degraded.  Large nonhemorrhagic acute right middle cerebral artery distribution infarct with slight compression of the right lateral ventricle without midline shift. Patient is at risk for development of further mass effect/hemorrhage as the infarct evolves.  Remote infarct with encephalomalacia left posterior temporal -parietal lobe.    MRI of the brain    MRA of the brain    2D Echocardiogram    Carotid Doppler    TCD    CXR   04/24/2013    1. Slightly increased pulmonary vascular congestion without evidence of pulmonary edema. 2. Stable position of left IJ hemodialysis catheter.   04/22/2013    Left IJ dialysis catheter tip mid SVC level  No acute finding    EKG    Therapy Recommendations   Physical Exam   Elderly caucasian lady not in distress. Afebrile. Head is nontraumatic. Neck is supple without bruit.   Cardiac exam no murmur or gallop. Lungs are clear to auscultation. Distal pulses are well felt. Neurological Exam : Drowsy but opens eyes easily. Right gaze preference but can look up to the left till midline. Does not blink to threat on the left but does so on the right. Pupils 4 mm irregular but sluggishly reactive. Fundi were not visualized. Speech is dysarthric and she is disoriented and confused and distractible. Follows simple one-step commands only. Tongue is midline. Left lower facial weakness. Left hemiparesis but will withdraw left upper and lower extremity to painful stimuli. Purposeful movements and right side against gravity. Left plantar is upgoing right is downgoing. Diminished sensation on the left  and some  left neglect. Gait was not tested. ASSESSMENT Ms. Kaitlyn Valdez is a 53 y.o. female presenting with confusion,  slurred speech, and left sided neglect. Imaging confirms a right wedge shaped MCA infarct. Infarct felt to be embolic given 3 prior infarcts in the left brain. Such large vessel infarcts unlikely to be associated with TTP which typically causes microangiopathy, especially recurrent strokes. Other differentials include:  cardioembolic source, primary hypercoagulable source, etc. Started on 3% saline protocol in order to decrease cerebral edema. Due to small size of stroke and physical exam, do not feel pt at risk for cerebral edema hence will dc hypertonic saline.. clopidogrel 75 mg orally every day prior to admission. Now on no antithrobotics due to TTP for secondary stroke prevention. Patient with resultant confusion, dysarthria, neurologic neglect. Work up underway.   Induced hypernatremia in order to decrease cerebral edema, Na 155 this am  Recurrent TTP , undergoing plasmaphoresis  Severe anemia  Acute Renal failure, Cr 2.4   Hypertension Hyperlipidemia, LDL 65, on pravachol 40 mg daily PTA, now on no statin as NPO, goal LDL < 100 (< 70 for diabetics) Hx strokes - baseline L HP per family suggests old right brain stroke; old left brain strokes seen on imaging Depression   Hospital day # 2  TREATMENT/PLAN  Continue ICU level care  No antithrombotics at this time for secondary stroke prevention due to TTP  Check 2D echo, TCD, carotid doppler  If unable to get MRI, do CT head in am  Stop 3% protocol. Let Na drift down; do not aggressively lower with fluids  Resume statin once able to swallow or document contraindication  Dr. Pearlean Brownie discussed diagnosis, prognosis,  treatment options and plan of care with Dr Tyson Alias, RN, entire care team, and pts sister.  Annie Main, MSN, RN, ANVP-BC, ANP-BC, GNP-BC [facility] Pager: [phone] 04/24/2013 9:33  AM This patient is critically ill and at significant risk of neurological worsening, death and care requires constant monitoring of vital signs, hemodynamics,respiratory and cardiac monitoring,review of multiple databases, neurological assessment, discussion with family, other specialists and medical decision making of high complexity. I spent 30 minutes of neurocritical care time  in the care of  this patient. I have personally obtained a history, examined the patient, evaluated imaging results, and formulated the assessment and plan of care. I agree with the above. Delia Heady, MD

## 2013-04-24 NOTE — Progress Notes (Signed)
[name] T Wrede   DOB:[birth year]   ZO#:[id number]#:[id number]    Subjective: The patient appears to be confused. There were no reported bleeding episodes. She denies any pain.  Objective:  Filed Vitals:   04/24/13 0813  BP: 155/99  Pulse: 98  Temp: 99.6 F (37.6 C)  Resp: 26     Intake/Output Summary (Last 24 hours) at 04/24/13 [id number] Last data filed at 04/24/13 0700  Gross per 24 hour  Intake   1900 ml  Output   1275 ml  Net    625 ml    GENERAL:alert, no distress but appears agitated  SKIN: skin color, texture, turgor are normal, no rashes or significant lesions EYES: normal, Conjunctiva are pink and non-injected, sclera clear OROPHARYNX:no exudate, no erythema and lips, buccal mucosa, and tongue normal  NECK: supple, thyroid normal size, non-tender, without nodularity LYMPH:  no palpable lymphadenopathy in the cervical, axillary or inguinal LUNGS: clear to auscultation and percussion with normal breathing effort HEART: regular rate & rhythm and no murmurs and no lower extremity edema ABDOMEN:abdomen soft, non-tender and normal bowel sounds Musculoskeletal:no cyanosis of digits and no clubbing  NEURO: alert but not oriented, with mild dysarthria and persistent left-sided neglect and weakness   Labs:  Lab Results  Component Value Date   WBC 4.4 04/24/2013   HGB 7.1* 04/24/2013   HCT 20.8* 04/24/2013   MCV 91.2 04/24/2013   PLT PENDING 04/24/2013   NEUTROABS 3.8 04/22/2013    Lab Results  Component Value Date   NA 153* 04/24/2013   NA 155* 04/24/2013   K 3.8 04/24/2013   CL 117* 04/24/2013   CO2 25 04/24/2013   Assessment & Plan:  #1 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 The patient also have mild low-grade fever.  Plasma pheresis  has been initiated with mild improvement of her platelet count I recommend to continue treatment until her platelet count normalized before initiation of taper off plasmapheresis #2 severe anemia  This could be due to hemolysis. Haptoglobin were low. I recommend consideration for blood transfusion. I would defer to primary service for this   #3 acute renal failure  The patient will proceed with plasmapheresis as above  #4 history of stroke  I recommend not to restart her on Plavix in the future due to risk of TTP with Plavix which has been reported  #5 hyponatremia Would defer to her nephrologist for adjustment  Community Hospital Of San BernardinoGORSUCH, Amaziah Ghosh, MD 04/24/2013  8:23 AM

## 2013-04-24 NOTE — Evaluation (Signed)
Clinical/Bedside Swallow Evaluation Patient Details  Name: Kaitlyn Valdez MRN: [id number] Date of Birth: [birth year]  Today's Date: 04/24/2013 Time: [id number] SLP Time Calculation (min): 33 min  Past Medical History:  Past Medical History  Diagnosis Date  . CVA (cerebral vascular accident)   . Depression   . Hypertension   . Hypercholesteremia   . TTP (thrombotic thrombopenic purpura)    Past Surgical History: No past surgical history on file. HPI:   53 yo with past medical history of CVA and baseline L side neglect found down at home, covered in emesis, confused with slurred speech and worsened L sided neglect.  Workup revealed thrombocytopenia.  Head CT revealed no acute findings but multiple prior infarcts. [facility]. [facility] revealed large nonhemorrhagic acute R MCA infarct.    Assessment / Plan / Recommendation Clinical Impression  Pt presents with a neurogenic dysphagia with aspiration risk secondary to likely impairments in function of hyolaryngeal complex, exacerbated by impulsivity, poor insight and self awareness.  Presented with + s/s of aspiration with thin liquids, but given sensory involvement may benefit from instrumental study to identify safest POs.  For now, recommend initiating a dysphagia 1, nectar-thick liquid diet; meds whole with puree.  Full supervision.  SLP will follow for dysphagia and requests cognitive assessment.   Educated sister-in-law, Kaitlyn MccreedyBarbara, re: recs/rationale and precautions, particularly safety issues due to cognitive deficits.     Aspiration Risk  Moderate    Diet Recommendation Dysphagia 1 (Puree);Nectar-thick liquid   Liquid Administration via: Cup Medication Administration: Whole meds with puree Supervision: Patient able to self feed;Full supervision/cueing for compensatory strategies Compensations: Slow rate;Small sips/bites Postural Changes and/or Swallow Maneuvers: Seated upright 90 degrees     Other  Recommendations Recommended Consults: MBS Oral Care Recommendations: Oral care BID Other Recommendations: Order thickener from pharmacy   Follow Up Recommendations   (tba)    Frequency and Duration min 2x/week  2 weeks   Pertinent Vitals/Pain No c/o pain    SLP Swallow Goals     Swallow Study Prior Functional Status       General Date of Onset: 04/22/13  Type of Study: Bedside swallow evaluation Previous Swallow Assessment: none per records Diet Prior to this Study: NPO Temperature Spikes Noted: No Respiratory Status: Room air Behavior/Cognition: Alert;Confused;Impulsive;Requires cueing Oral Cavity - Dentition: Dentures, top Self-Feeding Abilities: Needs assist;Able to feed self Patient Positioning: Upright in bed Baseline Vocal Quality: Clear Volitional Cough: Strong Volitional Swallow: Unable to elicit    Oral/Motor/Sensory Function Overall Oral Motor/Sensory Function: Other (comment) (asymmetry CN VII on left)   Ice Chips Ice chips: Impaired Presentation: Spoon Pharyngeal Phase Impairments: Suspected delayed Swallow;Throat Clearing - Immediate   Thin Liquid Thin Liquid: Impaired Presentation: Cup;Self Fed Pharyngeal  Phase Impairments: Suspected delayed Swallow;Multiple swallows;Cough - Immediate;Cough - Delayed    Nectar Thick Nectar Thick Liquid: Impaired Presentation: Self Fed Pharyngeal Phase Impairments: Multiple swallows   Honey Thick Honey Thick Liquid: Not tested   Puree Puree: Impaired Presentation: Self Fed Pharyngeal Phase Impairments: Suspected delayed Swallow;Multiple swallows   Solid    Solid: Not tested      Kaitlyn Valdez L. Kaitlyn Fredericouture, [place]MA CCC/SLP Pager [phone]  Blenda MountsCouture, Kaitlyn Valdez 04/24/2013,3:18 PM

## 2013-04-24 NOTE — Progress Notes (Signed)
Pt agitated and restless, in 3 point restraints. Unable to tolerate MRI without sedation. MD Dorna MaiSethi and Feinstein aware. Plan to get head CT in the morning. Will continue to monitor.

## 2013-04-24 NOTE — Progress Notes (Signed)
  Echocardiogram 2D Echocardiogram has been performed.  Kaitlyn Valdez FRANCES 04/24/2013, 5:06 PM

## 2013-04-24 NOTE — Progress Notes (Signed)
VASCULAR LAB PRELIMINARY  PRELIMINARY  PRELIMINARY  PRELIMINARY  Carotid duplex completed.    Preliminary report:  Bilateral:  1-39% ICA stenosis.  Vertebral artery flow is antegrade.     Shaunita Seney, RVS 04/24/2013, 10:37 AM

## 2013-04-25 LAB — POCT I-STAT, CHEM 8
BUN: 27 mg/dL — ABNORMAL HIGH (ref 6–23)
CALCIUM ION: 0.79 mmol/L — AB (ref 1.12–1.23)
Chloride: 104 mEq/L (ref 96–112)
Creatinine, Ser: 2.1 mg/dL — ABNORMAL HIGH (ref 0.50–1.10)
GLUCOSE: 112 mg/dL — AB (ref 70–99)
HEMATOCRIT: 38 % (ref 36.0–46.0)
Hemoglobin: 12.9 g/dL (ref 12.0–15.0)
Potassium: 2.8 mEq/L — CL (ref 3.7–5.3)
Sodium: 152 mEq/L — ABNORMAL HIGH (ref 137–147)
TCO2: 27 mmol/L (ref 0–100)

## 2013-04-25 LAB — BASIC METABOLIC PANEL
BUN: 27 mg/dL — ABNORMAL HIGH (ref 6–23)
CHLORIDE: 110 meq/L (ref 96–112)
CO2: 29 mEq/L (ref 19–32)
CREATININE: 2.31 mg/dL — AB (ref 0.50–1.10)
Calcium: 8.2 mg/dL — ABNORMAL LOW (ref 8.4–10.5)
GFR calc non Af Amer: 23 mL/min — ABNORMAL LOW (ref >90)
GFR, EST AFRICAN AMERICAN: 26 mL/min — AB (ref >90)
Glucose, Bld: 114 mg/dL — ABNORMAL HIGH (ref 70–99)
POTASSIUM: 3 meq/L — AB (ref 3.7–5.3)
Sodium: 152 mEq/L — ABNORMAL HIGH (ref 137–147)

## 2013-04-25 LAB — TYPE AND SCREEN
ABO/RH(D): A POS
ANTIBODY SCREEN: NEGATIVE
Unit division: 0

## 2013-04-25 LAB — TROPONIN I
TROPONIN I: 0.34 ng/mL — AB (ref <0.30)
Troponin I: <0.3 ng/mL (ref <0.30)

## 2013-04-25 LAB — THERAPEUTIC PLASMA EXCHANGE (BLOOD BANK)
Plasma Exchange: 3950
Plasma volume needed: 3950
UNIT DIVISION: 0
UNIT DIVISION: 0
UNIT DIVISION: 0
Unit division: 0
Unit division: 0
Unit division: 0
Unit division: 0
Unit division: 0
Unit division: 0
Unit division: 0
Unit division: 0
Unit division: 0
Unit division: 0

## 2013-04-25 LAB — CBC
HCT: 24.1 % — ABNORMAL LOW (ref 36.0–46.0)
HCT: 24.8 % — ABNORMAL LOW (ref 36.0–46.0)
HEMOGLOBIN: 8.1 g/dL — AB (ref 12.0–15.0)
Hemoglobin: 8.2 g/dL — ABNORMAL LOW (ref 12.0–15.0)
MCH: 31 pg (ref 26.0–34.0)
MCH: 30.6 pg (ref 26.0–34.0)
MCHC: 33.6 g/dL (ref 30.0–36.0)
MCHC: 33.1 g/dL (ref 30.0–36.0)
MCV: 92.3 fL (ref 78.0–100.0)
MCV: 92.5 fL (ref 78.0–100.0)
Platelets: 61 10*3/uL — ABNORMAL LOW (ref 150–400)
Platelets: 79 10*3/uL — ABNORMAL LOW (ref 150–400)
RBC: 2.61 MIL/uL — ABNORMAL LOW (ref 3.87–5.11)
RBC: 2.68 MIL/uL — AB (ref 3.87–5.11)
RDW: 16.8 % — AB (ref 11.5–15.5)
RDW: 16.6 % — ABNORMAL HIGH (ref 11.5–15.5)
WBC: 6.2 10*3/uL (ref 4.0–10.5)
WBC: 7.7 10*3/uL (ref 4.0–10.5)

## 2013-04-25 LAB — HEPATIC FUNCTION PANEL
ALT: 21 U/L (ref 0–35)
AST: 39 U/L — ABNORMAL HIGH (ref 0–37)
Albumin: 3.4 g/dL — ABNORMAL LOW (ref 3.5–5.2)
Alkaline Phosphatase: 65 U/L (ref 39–117)
BILIRUBIN TOTAL: 0.7 mg/dL (ref 0.3–1.2)
Total Protein: 5.9 g/dL — ABNORMAL LOW (ref 6.0–8.3)

## 2013-04-25 LAB — GLUCOSE, CAPILLARY: Glucose-Capillary: 106 mg/dL — ABNORMAL HIGH (ref 70–99)

## 2013-04-25 LAB — ADAMTS13 ACTIVITY: Adamts 13 Activity: 12 % Activity — ABNORMAL LOW (ref 68–163)

## 2013-04-25 LAB — LACTATE DEHYDROGENASE: LDH: 301 U/L — ABNORMAL HIGH (ref 94–250)

## 2013-04-25 LAB — PHOSPHORUS: PHOSPHORUS: 3.3 mg/dL (ref 2.3–4.6)

## 2013-04-25 MED ORDER — DIPHENHYDRAMINE HCL 25 MG PO CAPS: 25.0000 mg | ORAL_CAPSULE | Freq: Four times a day (QID) | ORAL | Status: DC | PRN

## 2013-04-25 MED ORDER — ACETAMINOPHEN 325 MG PO TABS: 650.0000 mg | ORAL_TABLET | ORAL | Status: DC | PRN

## 2013-04-25 MED ORDER — ACD FORMULA A 0.73-2.45-2.2 GM/100ML VI SOLN
Status: AC
  Filled 2013-04-25: qty 500

## 2013-04-25 MED ORDER — ACD FORMULA A 0.73-2.45-2.2 GM/100ML VI SOLN
500.0000 mL | Status: DC
  Administered 2013-04-25: 500 mL via INTRAVENOUS
  Filled 2013-04-25 (×2): qty 500

## 2013-04-25 MED ORDER — SIMVASTATIN 20 MG PO TABS
20.0000 mg | ORAL_TABLET | Freq: Every day | ORAL | Status: DC
  Administered 2013-04-25 – 2013-04-30 (×5): 20 mg via ORAL
  Filled 2013-04-25 (×7): qty 1

## 2013-04-25 MED ORDER — SODIUM CHLORIDE 0.9 % IV SOLN
4.0000 g | Freq: Once | INTRAVENOUS | Status: DC
  Filled 2013-04-25: qty 40

## 2013-04-25 MED ORDER — ENSURE PUDDING PO PUDG
1.0000 | Freq: Three times a day (TID) | ORAL | Status: DC
  Administered 2013-04-25 – 2013-04-30 (×9): 1 via ORAL

## 2013-04-25 MED ORDER — CALCIUM CARBONATE ANTACID 500 MG PO CHEW
2.0000 | CHEWABLE_TABLET | ORAL | Status: AC
  Filled 2013-04-25 (×2): qty 2

## 2013-04-25 MED ORDER — POTASSIUM CHLORIDE 20 MEQ/15ML (10%) PO LIQD
40.0000 meq | Freq: Two times a day (BID) | ORAL | Status: AC
  Administered 2013-04-25 (×2): 40 meq via ORAL
  Filled 2013-04-25 (×2): qty 30

## 2013-04-25 MED ORDER — POTASSIUM CHLORIDE 10 MEQ/50ML IV SOLN: 10.0000 meq | INTRAVENOUS | Status: DC

## 2013-04-25 MED ORDER — ANTICOAGULANT SODIUM CITRATE 4% (200MG/5ML) IV SOLN
5.0000 mL | Freq: Once | Status: AC
  Administered 2013-04-25: 2.8 mL
  Filled 2013-04-25: qty 250

## 2013-04-25 NOTE — Progress Notes (Signed)
INITIAL NUTRITION ASSESSMENT  DOCUMENTATION CODES Per approved criteria  -Not Applicable   INTERVENTION:  Ensure Pudding po TID, each supplement provides 170 kcal and 4 grams of protein  NUTRITION DIAGNOSIS: Predicted suboptimal energy intake related to stroke with dysphagia as evidenced by reported poor appetite and poor intake on MST.   Goal: Intake to meet >90% of estimated nutrition needs.  Monitor:  PO intake, labs, weight trend.  Reason for Assessment: MST  53 y.o. female  Admitting Dx: TTP (thrombotic thrombocytopenic purpura)  ASSESSMENT: Patient is a 53 yo with past medical history of CVA and baseline L side neglect found down at home, covered in emesis, confused with slurred speech and worsen L sided neglect. Workup revealed thrombocytopenia. [facility] revealed no acute findings but multiple prior infarcts. [facility]heres[facility].   Patient sleeping during RD visit. Sitter at bedside. Lunch tray on side table, untouched. Unable to complete nutrition focused physical exam.  Height:  Ht Readings from Last 1 Encounters:  04/25/13 5\' 5"  (1.651 m)    Weight: Wt Readings from Last 1 Encounters:  04/25/13 132 lb 11.5 oz (60.2 kg)    Ideal Body Weight: 56.8 kg  % Ideal Body Weight: 106%  Wt Readings from Last 10 Encounters:  04/25/13 132 lb 11.5 oz (60.2 kg)    Usual Body Weight: unknown  % Usual Body Weight: N/A  BMI:  Body mass index is 22.09 kg/(m^2). WNL  Estimated Nutritional Needs: Kcal: 1500-1700 Protein: 70-85 gm Fluid: 1.5-1.7 L  Skin: no wounds  Diet Order: Dysphagia 1 with nectar thick liquids  EDUCATION NEEDS: -Education not appropriate at this time   Intake/Output Summary (Last 24 hours) at 04/25/13 1524 Last data filed at 04/25/13 0600  Gross per 24 hour  Intake    100 ml  Output    445 ml  Net   -345 ml    Last BM: unknown   Labs:   Recent Labs Lab 04/22/13 2105  04/23/13 1541   04/24/13 0313 04/24/13 0759 04/24/13 0805 04/25/13 0440  NA 139  < > 144  < > 153*  155* 157* 155* 152*  K 4.6  < > 3.5*  --  3.8  --  3.3* 3.0*  CL 101  < > 102  --  117*  --  116* 110  CO2 21  --  29  --  25  --   --  29  BUN 26*  < > 30*  --  28*  --  26* 27*  CREATININE 2.49*  < > 2.67*  --  2.38*  --  2.40* 2.31*  CALCIUM 9.7  --  8.9  --  8.3*  --   --  8.2*  MG 2.0  --   --   --   --   --   --   --   PHOS 5.4*  --  4.4  --  4.0  --   --  3.3  GLUCOSE 107*  < > 91  --  107*  --  111* 114*  < > = values in this interval not displayed.  CBG (last 3)   Recent Labs  04/24/13 1547 04/24/13 1914 04/24/13 2359  GLUCAP 180* 171* 106*    Scheduled Meds: . anticoagulant sodium citrate  5 mL Intracatheter Once  . calcium carbonate  2 tablet Oral Q3H  . calcium gluconate IVPB  4 g Intravenous Once  . calcium gluconate  2 g  Intravenous Once  . citrate dextrose      . citrate dextrose      . pantoprazole (PROTONIX) IV  40 mg Intravenous Q24H  . potassium chloride  40 mEq Oral BID  . simvastatin  20 mg Oral q1800    Continuous Infusions: . citrate dextrose      Past Medical History  Diagnosis Date  . CVA (cerebral vascular accident)   . Depression   . Hypertension   . Hypercholesteremia   . TTP (thrombotic thrombopenic purpura)     No past surgical history on file.    Joaquin Courts, RD, LDN, CNSC Pager [phone] After Hours Pager [phone]

## 2013-04-25 NOTE — Progress Notes (Signed)
Name: Kaitlyn Valdez MRN: [id number] DOB: [birth year]    ADMISSION DATE:  04/22/2013 CONSULTATION DATE: 04/22/2013   REFERRING MD : [facility]   PRIMARY SERVICE: PCCM   CHIEF COMPLAINT: Acute encephalopathy   BRIEF PATIENT DESCRIPTION: 53 yo with past medical history of CVA and baseline L sided weakness found down at home, covered in emesis, confused with slurred speech and worsened L sided weakness. Workup revealed thrombocytopenia. Head CT revealed no acute findings but multiple prior infarcts. Transferred to [facility] for plasma phoresis. Repeat CT head at [facility] revealed large nonhemorrhagic acute R MCA infarct.   SIGNIFICANT EVENTS / STUDIES:  1/26 Found down, low platelets  1/26 Head CT [facility]) >>> no acute abnormality, multiple old infarcts  1/26 Transferred to [facility] for plasmapheresis  1/27 Renal u/s - No masses / hydro. Medical renal disease  1/27 CT head - Large nonhemorrhagic acute right middle cerebral artery distribution infarct with slight compression of the right lateral ventricle without midline shift. Patient is at risk for development of further mass effect/hemorrhage as the infarct evolves.  1/28 MRI brain - brady cant do 1/29 CT head - brady to unstable for this  LINES / TUBES:  Trialysis catheter 1/26 >>>  PIV 1/26 >>>  Foley 1/27>>>   CULTURES:  1/26 Blood >>>  1/26 Urine >>> negative  ANTIBIOTICS:  none   SUBJECTIVE:  Agitation at baseline. Bradycardia down to 40s-50s  Waist belt and restraints removed. Could not tolerate MRI yesterday. Will have repeat CT head today if able, unlikely  VITAL SIGNS: Temp:  [98.1 F (36.7 C)-100.2 F (37.9 C)] 100 F (37.8 C) (01/29 0410) Pulse Rate:  [52-115] 64 (01/29 0600) Resp:  [14-36] 22 (01/29 0600) BP: (94-155)/(49-116) 128/91 mmHg (01/29 0600) SpO2:  [90 %-100 %] 92 % (01/29 0600) Weight:  [59.9 kg (132 lb 0.9 oz)-60.2 kg (132 lb 11.5 oz)] 60.2 kg (132 lb 11.5 oz) (01/29 0500) HEMODYNAMICS:   VENTILATOR SETTINGS:   INTAKE / OUTPUT: Intake/Output     01/28 0701 - 01/29 0700   P.O. 100   Blood 12.5   Total Intake(mL/kg) 112.5 (1.9)   Urine (mL/kg/hr) 1240 (0.9)   Total Output 1240   Net -1127.5         PHYSICAL EXAMINATION: General: Alert, follows simple commands, oriented to name and president  HEENT: Oriental/AT, dry membranes, left facial droop  Cardiovascular: regular rhythm, tachycardic, S1S2 present, no murmur noted  Lungs: CTAB, no wheezing  Abdomen: Soft, nontender, bowel sounds diminished  Musculoskeletal: Moves 3/4 extremities spontaneously (no left arm), no edema  Extremities: cold to touch left foot/leg  Neuro: Encephalopathic, with L sided neglect, cough / gag present. RUE 5/5, LUE 0/5, LLE 4/5, RLE 5/5  Skin: Petechiae in the back, and excoriations UE   LABS:  CBC  Recent Labs Lab 04/24/13 1439 04/24/13 2200 04/25/13 0543  WBC 5.2 6.5 6.2  HGB 7.9* 8.0* 8.1*  HCT 23.5* 23.6* 24.1*  PLT 36* 49* 61*   Coag's  Recent Labs Lab 04/22/13 2105  APTT 36  INR 1.11   BMET  Recent Labs Lab 04/23/13 1541  04/24/13 0313 04/24/13 0759 04/24/13 0805 04/25/13 0440  NA 144  < > 153*  155* 157* 155* 152*  K 3.5*  --  3.8  --  3.3* 3.0*  CL 102  --  117*  --  116* 110  CO2 29  --  25  --   --  29  BUN 30*  --  28*  --  26* 27*  CREATININE 2.67*  --  2.38*  --  2.40* 2.31*  GLUCOSE 91  --  107*  --  111* 114*  < > = values in this interval not displayed. Electrolytes  Recent Labs Lab 04/22/13 2105 04/23/13 1541 04/24/13 0313 04/25/13 0440  CALCIUM 9.7 8.9 8.3* 8.2*  MG 2.0  --   --   --   PHOS 5.4* 4.4 4.0 3.3   Sepsis Markers No results found for this basename: LATICACIDVEN, PROCALCITON, O2SATVEN,  in the last 168 hours ABG No results found for this basename: PHART, PCO2ART, PO2ART,  in the last 168 hours Liver Enzymes  Recent Labs Lab 04/22/13 2105 04/23/13 1541 04/24/13 0313 04/25/13 0440  AST 54*  --   --  39*  ALT 28  --   --  21  ALKPHOS 70  --    --  65  BILITOT 1.9*  --   --  0.7  ALBUMIN 4.0 3.3* 3.1* 3.4*   Cardiac Enzymes No results found for this basename: TROPONINI, PROBNP,  in the last 168 hours Glucose  Recent Labs Lab 04/24/13 0355 04/24/13 0806 04/24/13 1148 04/24/13 1547 04/24/13 1914 04/24/13 2359  GLUCAP 105* 124* 121* 180* 171* 106*    Imaging Ct Head Wo Contrast  04/23/2013   CLINICAL DATA:  53 year old female with hypertension and hypercholesterolemia down down with new left arm and leg weakness with slurred speech. TTP with platelets of 10.  EXAM: CT HEAD WITHOUT CONTRAST  TECHNIQUE: Contiguous axial images were obtained from the base of the skull through the vertex without intravenous contrast.  COMPARISON:  None.  FINDINGS: Exam is motion degraded.  Large nonhemorrhagic acute right middle cerebral artery distribution infarct involving portions of the right temporal lobe, right operculum region, right subinsular region, right frontal lobe and right parietal lobe. Local mass effect with slight compression of the right lateral ventricle without midline shift.  Remote infarct with encephalomalacia left posterior temporal -parietal lobe.  Baseline atrophy without hydrocephalus.  No intracranial mass lesion noted on this unenhanced exam.  Mild mucosal thickening paranasal sinuses.  IMPRESSION: Exam is motion degraded.  Large nonhemorrhagic acute right middle cerebral artery distribution infarct with slight compression of the right lateral ventricle without midline shift. Patient is at risk for development of further mass effect/hemorrhage as the infarct evolves.  Remote infarct with encephalomalacia left posterior temporal -parietal lobe.  These results were called by telephone at the time of interpretation on 04/23/2013 at 3:16 PM to Dr. Amada Jupiter who verbally acknowledged these results.   Electronically Signed   By: Bridgett Larsson M.D.   On: 04/23/2013 15:19   US Renal Port  04/23/2013   CLINICAL DATA:  Acute renal  insufficiency  EXAM: RENAL/URINARY TRACT ULTRASOUND COMPLETE  COMPARISON:  None  FINDINGS: Right Kidney:  Length: 9.8 cm. Normal cortical thickness. Increased cortical echogenicity. No mass, hydronephrosis or shadowing calcification. Probable tiny amount of fluid adjacent to the upper pole of the left kidney. .  Left Kidney:  Length: 12.8 cm. Normal cortical thickness. Increased cortical echogenicity. No mass, hydronephrosis shadowing calcification. No perinephric fluid collection.  Bladder:  Appears normal for degree of bladder distention.  IMPRESSION: Increased renal cortical echogenicity bilaterally suggesting medical renal disease.  No evidence of renal mass or hydronephrosis.  Probable tiny amount of fluid adjacent to the upper pole of the left kidney, nonspecific.   Electronically Signed   By: Ulyses Southward M.D.   On: 04/23/2013 13:57  Dg Chest Port 1 View  04/24/2013   CLINICAL DATA:  Respiratory failure  EXAM: PORTABLE CHEST - 1 VIEW  COMPARISON:  Prior chest x-ray 04/22/2013  FINDINGS: Stable position of non tunneled left IJ approach hemodialysis catheter. The catheter tip is in good position at the distal SVC. Stable cardiac and mediastinal contours. Slightly increased pulmonary vascular congestion but no overt edema. No new airspace consolidation, pleural effusion or pneumothorax. No acute osseous abnormality.  IMPRESSION: 1. Slightly increased pulmonary vascular congestion without evidence of pulmonary edema. 2. Stable position of left IJ hemodialysis catheter.   Electronically Signed   By: Malachy Moan M.D.   On: 04/24/2013 07:48    CXR 1/28 - no defined infiltrate   ASSESSMENT / PLAN:   NEUROLOGIC  A:  Acute R MCA infarct - L sided neglect  Acute encephalopathy  Multiple previous CVAs, r/o emboli ECHO - 60-65%, no cardiac source of emboli identified P:  Neuro following  Hypertonic saline now off -- sodium trending down CT head this am unable as brady into 30's Carotid,  transcranial dopplers pending Resume pravachol 40mg  daily when able to swallow [place] records obtained and in chart  PULMONARY  A:  No respiratory distress  P:  Goal SpO2>92  Supplemental oxygen as needed   CARDIOVASCULAR  A:  Bradycardia asymptomatic etiology: cushings response?, ischemia Hx HTN  Hx dyslipidemia  ECHO - 60-65% EF, No wall motion abnl.  P:  Hold Valsartan / Pravachol  Plavix stopped indefinitely  Monitor tele and ecg  Atropine if further bradycardia at bedside Assess ischemia, trop Pacer pads on chest Consider dopamine to MAP 70-75 tsh  RENAL  A:  CKDIII (baseline SCr 1.7-2) (records obtained from [place]) Renal u/s - no mass / hydro  Hypokalemia  Hyperphosphatemia - corrected  Hypernatremia -- trending down s/p hypertonic saline Hyperchloremia - resolved P:  Replete K Trend BMP, allow na 154 No free water  Nephrology following  Daily TPE until PLTC normalized per hematology [place] records obtained and in chart  GASTROINTESTINAL  A:  Nutrition  GI Px  Moderate Aspiration Risk P:  Dysphagia 1 (puree) nectar thick per SLP  Protonix for GI Px   HEMATOLOGIC  A:  Suspected TTP - plavix discontinued  Anemia - s/p 1uPRBC - Hbg trending up Hyperbilirubinemia - resolved VTE Px  LDH trending down  Would ensure no heparin exposure, No HITT with associated cva  P:  F/u ADAMTS-13 - pending, may take 5 days  HIT panel pending (may take up to 1 week) unlikley , but with cva reasonable to r/o Trend CBC  SCDs  Hematology following, they will direct plasma xchange  Continue plasmapheresis   [facility] hospital records obtained and in chart   INFECTIOUS  A:  Low grade fever  No leukocytosis  P:  Follow cultures  Trend fever curve   ENDOCRINE  A:  No active isses  A1C 4.8 P:  No intervention required  TODAY'S SUMMARY: brady. ,no CT/mri, pacer pads, dopamine, cushings?  Lewie Chamber, MSIV, Medical Student 04/25/2013 7:12 AM   I  have personally obtained a history, examined the patient, evaluated laboratory and imaging results, formulated the assessment and plan and placed orders. CRITICAL CARE: The patient is critically ill with multiple organ systems failure and requires high complexity decision making for assessment and support, frequent evaluation and titration of therapies, application of advanced monitoring technologies and extensive interpretation of multiple databases. Critical Care Time devoted to patient care services described in this note  is 30 minutes.   Mcarthur Rossettianiel J. Tyson AliasFeinstein, MD, FACP Pgr: [phone] [facility]  Pulmonary and Critical Care Medicine [facility] Pager: [phone]  04/25/2013, 6:52 AM

## 2013-04-25 NOTE — Progress Notes (Signed)
VASCULAR LAB   TCD attempted.  Unable to complete--patient woke up and was talking and moving.  Will attempt again tomorrow.   Kiyoko Mcguirt, RVT 04/25/2013, 4:15 PM

## 2013-04-25 NOTE — Progress Notes (Signed)
CRITICAL VALUE ALERT  Critical value received:  Troponin 0.34  Date of notification: 04/25/13  Time of notification:  1015  Critical value read back:yes  Nurse who received alert:  Ree Edmanrystal Manus, RN  MD notified (1st page):  Dr. Tyson AliasFeinstein   Time of first page:  1015  MD notified (2nd page):  Time of second page:  Responding MD:  Dr. Tyson AliasFeinstein   Time MD responded:  [phone]

## 2013-04-25 NOTE — Progress Notes (Signed)
CT held per Dr. Tyson AliasFeinstein. Pt HR sustaining in 40s. Atropine at bedside. Pacer pads placed on patient.

## 2013-04-25 NOTE — Progress Notes (Signed)
[facility] Progress Note   Subjective:   Pt with no complaints today, wondering when she can leave.    Objective:   BP 128/91  Pulse 64  Temp(Src) 100 F (37.8 C) (Oral)  Resp 22  Wt 132 lb 11.5 oz (60.2 kg)  SpO2 91%  Intake/Output Summary (Last 24 hours) at 04/25/13 0744 Last data filed at 04/25/13 0600  Gross per 24 hour  Intake  112.5 ml  Output   1240 ml  Net -1127.5 ml   Weight change:   Physical Exam: Gen:NAD, in bed Cardio: Bradycardia, regular rhythm, no murmurs appreciated  Resp:CTAB, no wheezing NGE:XBMW/UX/LKAbd:Soft/NT/ND, no CVA tendernes Ext: no edema B/L  Neuro:  Awake, alert, not oriented, dysarthria w/ L sided neglect   Imaging: Ct Head Wo Contrast  04/23/2013   CLINICAL DATA:  53 year old female with hypertension and hypercholesterolemia down down with new left arm and leg weakness with slurred speech. TTP with platelets of 10.  EXAM: CT HEAD WITHOUT CONTRAST  TECHNIQUE: Contiguous axial images were obtained from the base of the skull through the vertex without intravenous contrast.  COMPARISON:  None.  FINDINGS: Exam is motion degraded.  Large nonhemorrhagic acute right middle cerebral artery distribution infarct involving portions of the right temporal lobe, right operculum region, right subinsular region, right frontal lobe and right parietal lobe. Local mass effect with slight compression of the right lateral ventricle without midline shift.  Remote infarct with encephalomalacia left posterior temporal -parietal lobe.  Baseline atrophy without hydrocephalus.  No intracranial mass lesion noted on this unenhanced exam.  Mild mucosal thickening paranasal sinuses.  IMPRESSION: Exam is motion degraded.  Large nonhemorrhagic acute right middle cerebral artery distribution infarct with slight compression of the right lateral ventricle without midline shift. Patient is at risk for development of further mass effect/hemorrhage as the infarct evolves.  Remote infarct  with encephalomalacia left posterior temporal -parietal lobe.  These results were called by telephone at the time of interpretation on 04/23/2013 at 3:16 PM to Dr. Amada JupiterKirkpatrick who verbally acknowledged these results.   Electronically Signed   By: Bridgett LarssonSteve  Olson M.D.   On: 04/23/2013 15:19   [place] Renal Port  04/23/2013   CLINICAL DATA:  Acute renal insufficiency  EXAM: RENAL/URINARY TRACT ULTRASOUND COMPLETE  COMPARISON:  None  FINDINGS: Right Kidney:  Length: 9.8 cm. Normal cortical thickness. Increased cortical echogenicity. No mass, hydronephrosis or shadowing calcification. Probable tiny amount of fluid adjacent to the upper pole of the left kidney. .  Left Kidney:  Length: 12.8 cm. Normal cortical thickness. Increased cortical echogenicity. No mass, hydronephrosis shadowing calcification. No perinephric fluid collection.  Bladder:  Appears normal for degree of bladder distention.  IMPRESSION: Increased renal cortical echogenicity bilaterally suggesting medical renal disease.  No evidence of renal mass or hydronephrosis.  Probable tiny amount of fluid adjacent to the upper pole of the left kidney, nonspecific.   Electronically Signed   By: Ulyses SouthwardMark  Boles M.D.   On: 04/23/2013 13:57   Dg Chest Port 1 View  04/24/2013   CLINICAL DATA:  Respiratory failure  EXAM: PORTABLE CHEST - 1 VIEW  COMPARISON:  Prior chest x-ray 04/22/2013  FINDINGS: Stable position of non tunneled left IJ approach hemodialysis catheter. The catheter tip is in good position at the distal SVC. Stable cardiac and mediastinal contours. Slightly increased pulmonary vascular congestion but no overt edema. No new airspace consolidation, pleural effusion or pneumothorax. No acute osseous abnormality.  IMPRESSION: 1. Slightly increased pulmonary vascular congestion without  evidence of pulmonary edema. 2. Stable position of left IJ hemodialysis catheter.   Electronically Signed   By: Malachy Moan M.D.   On: 04/24/2013 07:48     Labs: BMET  Recent Labs Lab 04/22/13 2105 04/23/13 1610 04/23/13 1541 04/23/13 2100 04/24/13 0313 04/24/13 0759 04/24/13 0805 04/25/13 0440  NA 139 142 144 149* 153*  155* 157* 155* 152*  K 4.6 3.9 3.5*  --  3.8  --  3.3* 3.0*  CL 101 104 102  --  117*  --  116* 110  CO2 21  --  29  --  25  --   --  29  GLUCOSE 107* 102* 91  --  107*  --  111* 114*  BUN 26* 33* 30*  --  28*  --  26* 27*  CREATININE 2.49* 3.00* 2.67*  --  2.38*  --  2.40* 2.31*  CALCIUM 9.7  --  8.9  --  8.3*  --   --  8.2*  PHOS 5.4*  --  4.4  --  4.0  --   --  3.3   CBC  Recent Labs Lab 04/22/13 2105  04/24/13 0759 04/24/13 0805 04/24/13 1439 04/24/13 2200 04/25/13 0543  WBC 6.4  < > 4.4  --  5.2 6.5 6.2  NEUTROABS 3.8  --   --   --   --   --   --   HGB 11.6*  < > 7.1* 6.5* 7.9* 8.0* 8.1*  HCT 33.4*  < > 20.8* 19.0* 23.5* 23.6* 24.1*  MCV 91.0  < > 91.2  --  91.4 91.8 92.3  PLT 10*  < > 40*  --  36* 49* 61*  < > = values in this interval not displayed.  Medications:    . calcium gluconate  2 g Intravenous Once  . pantoprazole (PROTONIX) IV  40 mg Intravenous Q24H      Assessment/ Plan:   Pt is a 53 y/o FM w/ PMHx of CVA w/ baseline L sided neglect found down at home with confusion and slurred speech w/ thrombocytopenia, hemolytic anemia, AKI, fever, and confusion dx with TTP.  1) TTP with AKI - Pt with pentad consistent with TTP secondary to most likely Plavix. ADAMTS 13 pending. LDH trending down.  NO MORE PLAVIX.  2) H/O of CVA with L sided Neglect 3) HTN 4) Hyperlipidemia  5) Hypernatremia - intentional per neuro using hypertonic saline so will defer further mgmt to them 6)Bradycardia - new onset, EKG this AM looks like junctional escape rhythm.  TTP can cause arrhthymias and acute MI's.   Plan: 1) Continue Plasmaspheresis as needed, will defer to hematology, appreciate recommendations. (Today is Day #3) 2) Trend daily creatinine, I/O, BP's 3) MRI/MRA or CT head w/o contrast  per neurology  4) F/U CBC for platelets, LDH, and ADAMTS13 5) Continue to trend Na now off of hypertonic saline  6) Troponin x 3, consider cardiology consult if no improvement in rate.   Briscoe Deutscher, DO PGY-2, [facility] 04/25/2013, 7:44 AM  I have seen and examined this patient and agree with plan as outlined in the above notes.  Day #3 PLEX.  Renal function stable to improved, platelet count up to 60, LDH 301; low haptoglobin c/w hemolytic process.  Off hypertonic saline (given per neuro) and sodium drifting back down.  Bradycardia issues requiring atropine - TTP can be associated with intracardiac thrombi - check troponins.  Consider cards if no change.  Jazia Faraci B,MD 04/25/2013 11:56 AM

## 2013-04-25 NOTE — Progress Notes (Signed)
[name], PA-C Physician Assistant Signed Neurology Progress Notes Service date: 04/25/2013 10:00 AM  Stroke Team Progress Note    HISTORY Kaitlyn Valdez is an 53 y.o. female with history of stroke that affected her RIGHT side per family member who are at bedside. Daughter states she had been talking to her on the phone between the hours of 9-10 AM and she was speaking normally. Her aunt found patient around 53 AM 04/21/2013 down at home, and per note she was confused, showed slurred speech and lever left sided neglect. Family at bedside state the  left sided neglect is new. Patient was transferred to [facility] hospital from [facility] due to PLT count 22 and concern for TTP. TPE was initiated and patient was brought to [facility] hospital. Neurology was consulted concerning possibility of adding Plavix to TPE for embolic prevention.   Patient was not administerd TPA secondary to PLT count of 10. She was admitted to [facility] for further evaluation and treatment.   SUBJECTIVE The patient's daughter was in the room this morning. Dr. Pearlean Brownie discussed the patient condition with the daughter. The patient appeared to be doing better. She was more oriented although she continues to have severe left neglect.   OBJECTIVE Most recent Vital Signs: Filed Vitals:     04/25/13 1000  04/25/13 1100  04/25/13 1200  04/25/13 1233   BP:  132/70  132/77  141/78     Pulse:  62  56  55     Temp:        99.4 F (37.4 C)   TempSrc:  Oral  Oral  Oral     Resp:  19  20  21      Weight:           SpO2:  97%  94%  88%      CBG (last 3)   Recent Labs   04/24/13 1547  04/24/13 1914  04/24/13 2359   GLUCAP  180*  171*  106*      IV Fluid Intake:    .  citrate dextrose         MEDICATIONS  .  anticoagulant sodium citrate   5 mL  Intracatheter  Once   .  calcium carbonate   2 tablet  Oral  Q3H   .  calcium gluconate IVPB   4 g  Intravenous  Once   .  calcium gluconate   2 g  Intravenous  Once   .  citrate dextrose           .  citrate dextrose          .  pantoprazole (PROTONIX) IV   40 mg  Intravenous  Q24H   .  potassium chloride   40 mEq  Oral  BID    PRN:  acetaminophen, diphenhydrAMINE, diphenhydrAMINE, haloperidol lactate, ondansetron (ZOFRAN) IV, RESOURCE THICKENUP CLEAR  Diet:  Dysphagia 1 nectar thick liquids Activity:  Bedrest DVT Prophylaxis:  SCDs    CLINICALLY SIGNIFICANT STUDIES Basic Metabolic Panel:  Recent Labs Lab  04/22/13 2105    04/24/13 0313    04/24/13 0805  04/25/13 0440   NA  139   < >  153*  155*   < >  155*  152*   K  4.6   < >  3.8   --   3.3*  3.0*   CL  101   < >  117*   --   116*  110  CO2  21   < >  25   --    --   29   GLUCOSE  107*   < >  107*   --   111*  114*   BUN  26*   < >  28*   --   26*  27*   CREATININE  2.49*   < >  2.38*   --   2.40*  2.31*   CALCIUM  9.7   < >  8.3*   --    --   8.2*   MG  2.0   --    --    --    --    --    PHOS  5.4*   < >  4.0   --    --   3.3    < > = values in this interval not displayed. Liver Function Tests:  Recent Labs Lab  04/22/13 2105    04/24/13 0313  04/25/13 0440   AST  54*   --    --   39*   ALT  28   --    --   21   ALKPHOS  70   --    --   65   BILITOT  1.9*   --    --   0.7   PROT  7.4   --    --   5.9*   ALBUMIN  4.0   < >  3.1*  3.4*    < > = values in this interval not displayed. CBC:  Recent Labs Lab  04/22/13 2105    04/24/13 2200  04/25/13 0543   WBC  6.4   < >  6.5  6.2   NEUTROABS  3.8   --    --    --    HGB  11.6*   < >  8.0*  8.1*   HCT  33.4*   < >  23.6*  24.1*   MCV  91.0   < >  91.8  92.3   PLT  10*   < >  49*  61*    < > = values in this interval not displayed. Coagulation:  Recent Labs Lab  04/22/13 2105   LABPROT  14.1   INR  1.11    Cardiac Enzymes:  Recent Labs Lab  04/25/13 1010   TROPONINI  0.34*    Urinalysis:  Recent Labs Lab  04/23/13 1157   COLORURINE  AMBER*   LABSPEC  1.021   PHURINE  6.5   GLUCOSEU  NEGATIVE   HGBUR  LARGE*   BILIRUBINUR   SMALL*   KETONESUR  15*   PROTEINUR  >300*   UROBILINOGEN  1.0   NITRITE  NEGATIVE   LEUKOCYTESUR  NEGATIVE    Lipid Panel    Component  Value  Date/Time     CHOL  122  04/24/2013 0313     TRIG  99  04/24/2013 0313     HDL  37*  04/24/2013 0313     CHOLHDL  3.3  04/24/2013 0313     VLDL  20  04/24/2013 0313     LDLCALC  65  04/24/2013 0313    HgbA1C   Lab Results   Component  Value  Date     HGBA1C  4.8  04/23/2013      Urine Drug Screen:    No results found for this basename: labopia,  cocainscrnur,  labbenz,  amphetmu,  thcu,  labbarb      Alcohol Level: No results found for this basename: ETH,  in the last 168 hours   US Renal Port  04/23/2013    Increased renal cortical echogenicity bilaterally suggesting medical renal disease.  No evidence of renal mass or hydronephrosis.  Probable tiny amount of fluid adjacent to the upper pole of the left kidney, nonspecific.     CT of the brain    04/24/2013 04/23/2013  Exam is motion degraded.  Large nonhemorrhagic acute right middle cerebral artery distribution infarct with slight compression of the right lateral ventricle without midline shift. Patient is at risk for development of further mass effect/hemorrhage as the infarct evolves.  Remote infarct with encephalomalacia left posterior temporal -parietal lobe.     MRI of the brain     MRA of the brain     2D Echocardiogram  EF 60-65% with no source of embolus.    Carotid Doppler  Bilateral: 1-39% ICA stenosis. Vertebral artery flow is antegrade.    TCD     CXR   04/24/2013    1. Slightly increased pulmonary vascular congestion without evidence of pulmonary edema. 2. Stable position of left IJ hemodialysis catheter.    04/22/2013    Left IJ dialysis catheter tip mid SVC level  No acute finding     EKG  Sinus bradycardia with short PR. Nonspecific ST abnormality   Therapy Recommendations    Physical Exam   Elderly caucasian lady not in distress. Afebrile. Head is  nontraumatic. Neck is supple without bruit.   Cardiac exam no murmur or gallop. Lungs are clear to auscultation. Distal pulses are well felt. Neurological Exam : Awake alert. Right gaze preference but can look up to the left till midline. Does not blink to threat on the left but does so on the right. Pupils 4 mm irregular but sluggishly reactive. Fundi were not visualized. Speech is dysarthric and she is disoriented and confused and distractible. Follows simple one-step commands only. Tongue is midline. Left lower facial weakness. Left hemiparesis but will withdraw left upper and lower extremity to painful stimuli. Purposeful movements and right side against gravity. Left plantar is upgoing right is downgoing. Diminished sensation on the left  and significant left hemineglect. Gait was not tested.   ASSESSMENT Ms. ROBBI SPELLS is a 53 y.o. female presenting with confusion, slurred speech, and left sided neglect. Imaging confirms a right wedge shaped MCA infarct. Infarct felt to be embolic given 3 prior infarcts in the left brain. Such large vessel infarcts unlikely to be associated with TTP which typically causes microangiopathy, especially recurrent strokes. Other differentials include:  cardioembolic source, primary hypercoagulable source, etc. Patient on clopidogrel 75 mg orally every day prior to admission. Now on no antithrobotics due to TTP for secondary stroke prevention. Patient with resultant confusion, dysarthria, neurologic neglect, dysphagia.     Recurrent TTP , undergoing plasmaphoresis Severe anemia Acute Renal failure, Cr 2.4    Induced hypernatremia in order to decrease cerebral edema, Na 152 this am, protocol stopped 04/24/2013, Na drifting down slowly Hypertension Hyperlipidemia, LDL 65, on pravachol 40 mg daily PTA, now on no statin as NPO, goal LDL < 100 (< 70 for diabetics) Hx strokes - baseline L HP per family suggests old right brain stroke; old left brain strokes seen on  imaging Depression       Hospital day # 3   TREATMENT/PLAN Continue ICU level care No antithrombotics  at this time for secondary stroke prevention due to TTP Await transcranial Doppler results F/u CT head results - still pending Resume statin ( Pravachol 40 mg daily prior to admission ) Await further records from outside facility   Delton Seeavid Rinehuls PA-C [facility] Pager [phone] 04/25/2013, 1:32 PM I have personally examined this patient, reviewed pertinent data, develop plan of care and agree with the above Delia HeadyPramod Sethi, MD

## 2013-04-25 NOTE — Progress Notes (Addendum)
SLP Cancellation Note  Patient Details Name: [name] T Frankum MRN: [id number] DOB: [birth year]   Cancelled treatment:        Pt. Having procedure in room.  Pt. Would benefit from an MBS, however RN reported pt. Is unable to leave unit (for CT) due to bradycardia.  ST may return today, if possible to observe with Dys 1, nectar liquids, if able.  Pt. May benefit from PT/OT evals and cognitive assessment with ST.  Please order if agree.   Breck CoonsLisa Willis VeronaLitaker M.Ed [employer]-SLP Pager [phone]  04/25/2013

## 2013-04-25 NOTE — Progress Notes (Signed)
Kaitlyn Valdez   DOB:Feb 20, 1961   AV#:[id number]    Subjective: The patient is slightly better today. Less confusion. She still had persistent dysarthria and left-sided hemiparesis. No new neurological deficit.  Objective:  Filed Vitals:   04/25/13 0700  BP:   Pulse:   Temp: 99.2 F (37.3 C)  Resp:      Intake/Output Summary (Last 24 hours) at 04/25/13 [id number] Last data filed at 04/25/13 0600  Gross per 24 hour  Intake  112.5 ml  Output    990 ml  Net -877.5 ml    GENERAL:alert, no distress and comfortable SKIN: skin color, texture, turgor are normal, no rashes or significant lesions LUNGS: clear to auscultation and percussion with normal breathing effort HEART: regular rate & rhythm and no murmurs and no lower extremity edema Musculoskeletal:no cyanosis of digits and no clubbing  NEURO: alert with dysarthria, oriented in person, still confused about place but is aware she is in the hospital and oriented in time with persistent left-sided neurological deficits   Labs:  Lab Results  Component Value Date   WBC 6.2 04/25/2013   HGB 8.1* 04/25/2013   HCT 24.1* 04/25/2013   MCV 92.3 04/25/2013   PLT 61* 04/25/2013   NEUTROABS 3.8 04/22/2013    Lab Results  Component Value Date   NA 152* 04/25/2013   K 3.0* 04/25/2013   CL 110 04/25/2013   CO2 29 04/25/2013   ADAMTS13 is pending  Studies: CT scan of the head is reviewed which show significant large right nonhemorrhagic stroke. Echocardiogram showed preserved ejection fraction.   Assessment & Plan:  #1 Severe thrombocytopenia due to TTP This is a lady with reportedly background history of TTP and left-sided weakness, have evidence of severe thrombocytopenia, evidence of acute renal failure and elevated bilirubin as well as LDH, suspect TTP. The patient also have mild low-grade fever on admission.  Plasma pheresis has been initiated on 2/27 with improvement of her platelet count I recommend to continue treatment daily until her  platelet count normalized (>140) before initiation of tapering of plasmapheresis #2 severe anemia  This could be due to hemolysis. Haptoglobin were low. She received blood transfusion yesterday.  #3 acute renal failure  The patient will proceed with plasmapheresis as above  #4 history of stroke  I recommend not to restart her on Plavix in the future due to risk of TTP with Plavix which has been reported  #5 hypernatremia Would defer to her nephrologist for adjustment  Will continue daily [facility] Zakkery Dorian, MD 04/25/2013  8:24 AM

## 2013-04-25 NOTE — Progress Notes (Deleted)
Stroke Team Progress Note  HISTORY Kaitlyn Valdez is an 53 y.o. female with history of stroke that affected her RIGHT side per family member who are at bedside. Daughter states she had been talking to her on the phone between the hours of 9-10 AM and she was speaking normally. Her aunt found patient around 6711 AM 04/21/2013 down at home, and per note she was confused, showed slurred speech and lever left sided neglect. Family at bedside state the  left sided neglect is new. [facility] due to PLT count 22 and concern for TTP. TPE was initiated and [facility]. Neurology was consulted concerning possibility of adding Plavix to TPE for embolic prevention.  Patient was not administerd TPA secondary to PLT count of 10. [facility] and [facility].  SUBJECTIVE The patient's daughter was in the room this morning. Dr. Pearlean BrownieSethi discussed the patient condition with the daughter. The patient appeared to be doing better. She was more oriented although she continues to have severe left neglect.  OBJECTIVE Most recent Vital Signs: Filed Vitals:   04/25/13 1000 04/25/13 1100 04/25/13 1200 04/25/13 1233  BP: 132/70 132/77 141/78   Pulse: 62 56 55   Temp:    99.4 F (37.4 C)  TempSrc: Oral Oral Oral   Resp: 19 20 21    Weight:      SpO2: 97% 94% 88%    CBG (last 3)   Recent Labs  04/24/13 1547 04/24/13 1914 04/24/13 2359  GLUCAP 180* 171* 106*    IV Fluid Intake:   . citrate dextrose      MEDICATIONS  . anticoagulant sodium citrate  5 mL Intracatheter Once  . calcium carbonate  2 tablet Oral Q3H  . calcium gluconate IVPB  4 g Intravenous Once  . calcium gluconate  2 g Intravenous Once  . citrate dextrose      . citrate dextrose      . pantoprazole (PROTONIX) IV  40 mg Intravenous Q24H  . potassium chloride  40 mEq Oral BID   PRN:  acetaminophen, diphenhydrAMINE, diphenhydrAMINE, haloperidol  lactate, ondansetron (ZOFRAN) IV, RESOURCE THICKENUP CLEAR  Diet:  Dysphagia 1 nectar thick liquids Activity:  Bedrest DVT Prophylaxis:  SCDs   CLINICALLY SIGNIFICANT STUDIES Basic Metabolic Panel:  Recent Labs Lab 04/22/13 2105  04/24/13 0313  04/24/13 0805 04/25/13 0440  NA 139  < > 153*  155*  < > 155* 152*  K 4.6  < > 3.8  --  3.3* 3.0*  CL 101  < > 117*  --  116* 110  CO2 21  < > 25  --   --  29  GLUCOSE 107*  < > 107*  --  111* 114*  BUN 26*  < > 28*  --  26* 27*  CREATININE 2.49*  < > 2.38*  --  2.40* 2.31*  CALCIUM 9.7  < > 8.3*  --   --  8.2*  MG 2.0  --   --   --   --   --   PHOS 5.4*  < > 4.0  --   --  3.3  < > = values in this interval not displayed. Liver Function Tests:  Recent Labs Lab 04/22/13 2105  04/24/13 0313 04/25/13 0440  AST 54*  --   --  39*  ALT 28  --   --  21  ALKPHOS 70  --   --  65  BILITOT 1.9*  --   --  0.7  PROT 7.4  --   --  5.9*  ALBUMIN 4.0  < > 3.1* 3.4*  < > = values in this interval not displayed. CBC:  Recent Labs Lab 04/22/13 2105  04/24/13 2200 04/25/13 0543  WBC 6.4  < > 6.5 6.2  NEUTROABS 3.8  --   --   --   HGB 11.6*  < > 8.0* 8.1*  HCT 33.4*  < > 23.6* 24.1*  MCV 91.0  < > 91.8 92.3  PLT 10*  < > 49* 61*  < > = values in this interval not displayed. Coagulation:   Recent Labs Lab 04/22/13 2105  LABPROT 14.1  INR 1.11   Cardiac Enzymes:   Recent Labs Lab 04/25/13 1010  TROPONINI 0.34*   Urinalysis:   Recent Labs Lab 04/23/13 1157  COLORURINE AMBER*  LABSPEC 1.021  PHURINE 6.5  GLUCOSEU NEGATIVE  HGBUR LARGE*  BILIRUBINUR SMALL*  KETONESUR 15*  PROTEINUR >300*  UROBILINOGEN 1.0  NITRITE NEGATIVE  LEUKOCYTESUR NEGATIVE   Lipid Panel    Component Value Date/Time   CHOL 122 04/24/2013 0313   TRIG 99 04/24/2013 0313   HDL 37* 04/24/2013 0313   CHOLHDL 3.3 04/24/2013 0313   VLDL 20 04/24/2013 0313   LDLCALC 65 04/24/2013 0313   HgbA1C  Lab Results  Component Value Date   HGBA1C 4.8  04/23/2013    Urine Drug Screen:   No results found for this basename: labopia,  cocainscrnur,  labbenz,  amphetmu,  thcu,  labbarb    Alcohol Level: No results found for this basename: ETH,  in the last 168 hours  US Renal Port  04/23/2013    Increased renal cortical echogenicity bilaterally suggesting medical renal disease.  No evidence of renal mass or hydronephrosis.  Probable tiny amount of fluid adjacent to the upper pole of the left kidney, nonspecific.    CT of the brain   04/24/2013 04/23/2013  Exam is motion degraded.  Large nonhemorrhagic acute right middle cerebral artery distribution infarct with slight compression of the right lateral ventricle without midline shift. Patient is at risk for development of further mass effect/hemorrhage as the infarct evolves.  Remote infarct with encephalomalacia left posterior temporal -parietal lobe.    MRI of the brain    MRA of the brain    2D Echocardiogram  EF 60-65% with no source of embolus.   Carotid Doppler  Bilateral: 1-39% ICA stenosis. Vertebral artery flow is antegrade.   TCD    CXR   04/24/2013    1. Slightly increased pulmonary vascular congestion without evidence of pulmonary edema. 2. Stable position of left IJ hemodialysis catheter.   04/22/2013    Left IJ dialysis catheter tip mid SVC level  No acute finding    EKG  Sinus bradycardia with short PR. Nonspecific ST abnormality  Therapy Recommendations   Physical Exam   Elderly caucasian lady not in distress. Afebrile. Head is nontraumatic. Neck is supple without bruit.   Cardiac exam no murmur or gallop. Lungs are clear to auscultation. Distal pulses are well felt. Neurological Exam : Drowsy but opens eyes easily. Right gaze preference but can look up to the left till midline. Does not blink to threat on the left but does so on the right. Pupils 4 mm irregular but sluggishly reactive. Fundi were not visualized. Speech is dysarthric and she is disoriented and confused and  distractible. Follows simple one-step commands only.  Tongue is midline. Left lower facial weakness. Left hemiparesis but will withdraw left upper and lower extremity to painful stimuli. Purposeful movements and right side against gravity. Left plantar is upgoing right is downgoing. Diminished sensation on the left  and some left neglect. Gait was not tested.  ASSESSMENT Kaitlyn Valdez is a 53 y.o. female presenting with confusion, slurred speech, and left sided neglect. Imaging confirms a right wedge shaped MCA infarct. Infarct felt to be embolic given 3 prior infarcts in the left brain. Such large vessel infarcts unlikely to be associated with TTP which typically causes microangiopathy, especially recurrent strokes. Other differentials include:  cardioembolic source, primary hypercoagulable source, etc. Patient on clopidogrel 75 mg orally every day prior to admission. Now on no antithrobotics due to TTP for secondary stroke prevention. Patient with resultant confusion, dysarthria, neurologic neglect, dysphagia.    Recurrent TTP , undergoing plasmaphoresis  Severe anemia  Acute Renal failure, Cr 2.4   Induced hypernatremia in order to decrease cerebral edema, Na 152 this am, protocol stopped 04/24/2013, Na drifting down slowly  Hypertension Hyperlipidemia, LDL 65, on pravachol 40 mg daily PTA, now on no statin as NPO, goal LDL < 100 (< 70 for diabetics) Hx strokes - baseline L HP per family suggests old right brain stroke; old left brain strokes seen on imaging Depression    Hospital day # 3  TREATMENT/PLAN  Continue ICU level care  No antithrombotics at this time for secondary stroke prevention due to TTP  Await transcranial Doppler results  F/u CT head results - still pending  Resume statin ( Pravachol 40 mg daily prior to admission )  Await further records from outside facility

## 2013-04-25 NOTE — Progress Notes (Signed)
[facility] FOR AM LAB REPLACEMENT ONLY  The patient does not apply for the [facility] Electrolyte [facility] Protocol based on the criteria listed below:   1. Is GFR >/= 40 ml/min? no  Patient's GFR today is 23 2. Is urine output >/= 0.5 ml/kg/hr for the last 6 hours? yes Patient's UOP is 0.65 ml/kg/hr 3. Is BUN < 60 mg/dL? yes  Patient's BUN today is 27 4. Abnormal electrolyte(s): K 3.0 5. Ordered repletion with: NA 6. If Valdez panic level lab has been reported, has the CCM MD in charge been notified? yes.   Physician:  Dr Kendrick FriesmcQuaid He will defer to AM rounding MD  [facility], Kaitlyn Valdez 04/25/2013 6:48 AM

## 2013-04-26 ENCOUNTER — Ambulatory Visit (HOSPITAL_COMMUNITY): Payer: Medicare HMO

## 2013-04-26 ENCOUNTER — Inpatient Hospital Stay (HOSPITAL_COMMUNITY): Payer: Medicare HMO

## 2013-04-26 DIAGNOSIS — D696 Thrombocytopenia, unspecified: Secondary | ICD-10-CM

## 2013-04-26 DIAGNOSIS — E870 Hyperosmolality and hypernatremia: Secondary | ICD-10-CM

## 2013-04-26 LAB — CBC
HCT: 25.6 % — ABNORMAL LOW (ref 36.0–46.0)
HCT: 20.3 % — ABNORMAL LOW (ref 36.0–46.0)
HEMOGLOBIN: 6.7 g/dL — AB (ref 12.0–15.0)
Hemoglobin: 8.6 g/dL — ABNORMAL LOW (ref 12.0–15.0)
MCH: 31.2 pg (ref 26.0–34.0)
MCH: 31.2 pg (ref 26.0–34.0)
MCHC: 33.6 g/dL (ref 30.0–36.0)
MCHC: 33 g/dL (ref 30.0–36.0)
MCV: 92.8 fL (ref 78.0–100.0)
MCV: 94.4 fL (ref 78.0–100.0)
PLATELETS: 95 10*3/uL — AB (ref 150–400)
PLATELETS: 94 10*3/uL — AB (ref 150–400)
RBC: 2.76 MIL/uL — ABNORMAL LOW (ref 3.87–5.11)
RBC: 2.15 MIL/uL — ABNORMAL LOW (ref 3.87–5.11)
RDW: 15.9 % — AB (ref 11.5–15.5)
RDW: 15.8 % — ABNORMAL HIGH (ref 11.5–15.5)
WBC: 7.3 10*3/uL (ref 4.0–10.5)
WBC: 5.5 10*3/uL (ref 4.0–10.5)

## 2013-04-26 LAB — THERAPEUTIC PLASMA EXCHANGE (BLOOD BANK)
PLASMA VOLUME NEEDED: 3950
Plasma Exchange: 3953
UNIT DIVISION: 0
UNIT DIVISION: 0
UNIT DIVISION: 0
UNIT DIVISION: 0
UNIT DIVISION: 0
Unit division: 0
Unit division: 0
Unit division: 0
Unit division: 0
Unit division: 0
Unit division: 0
Unit division: 0
Unit division: 0
Unit division: 0

## 2013-04-26 LAB — URINALYSIS, ROUTINE W REFLEX MICROSCOPIC
BILIRUBIN URINE: NEGATIVE
BILIRUBIN URINE: NEGATIVE
GLUCOSE, UA: NEGATIVE mg/dL
Glucose, UA: NEGATIVE mg/dL
Ketones, ur: NEGATIVE mg/dL
Ketones, ur: NEGATIVE mg/dL
LEUKOCYTES UA: NEGATIVE
Leukocytes, UA: NEGATIVE
NITRITE: NEGATIVE
Nitrite: NEGATIVE
PH: 8.5 — AB (ref 5.0–8.0)
PH: 8.5 — AB (ref 5.0–8.0)
PROTEIN: NEGATIVE mg/dL
Protein, ur: 30 mg/dL — AB
SPECIFIC GRAVITY, URINE: 1.017 (ref 1.005–1.030)
Specific Gravity, Urine: 1.015 (ref 1.005–1.030)
Urobilinogen, UA: 1 mg/dL (ref 0.0–1.0)
Urobilinogen, UA: 1 mg/dL (ref 0.0–1.0)

## 2013-04-26 LAB — POCT I-STAT, CHEM 8
BUN: 23 mg/dL (ref 6–23)
CREATININE: 2 mg/dL — AB (ref 0.50–1.10)
Calcium, Ion: 1.02 mmol/L — ABNORMAL LOW (ref 1.12–1.23)
Chloride: 105 mEq/L (ref 96–112)
GLUCOSE: 106 mg/dL — AB (ref 70–99)
HEMATOCRIT: 25 % — AB (ref 36.0–46.0)
Hemoglobin: 8.5 g/dL — ABNORMAL LOW (ref 12.0–15.0)
POTASSIUM: 3.3 meq/L — AB (ref 3.7–5.3)
Sodium: 151 mEq/L — ABNORMAL HIGH (ref 137–147)
TCO2: 28 mmol/L (ref 0–100)

## 2013-04-26 LAB — RENAL FUNCTION PANEL
Albumin: 3.2 g/dL — ABNORMAL LOW (ref 3.5–5.2)
BUN: 25 mg/dL — ABNORMAL HIGH (ref 6–23)
CALCIUM: 8.2 mg/dL — AB (ref 8.4–10.5)
CO2: 30 mEq/L (ref 19–32)
Chloride: 110 mEq/L (ref 96–112)
Creatinine, Ser: 2.1 mg/dL — ABNORMAL HIGH (ref 0.50–1.10)
GFR calc Af Amer: 30 mL/min — ABNORMAL LOW (ref >90)
GFR calc non Af Amer: 26 mL/min — ABNORMAL LOW (ref >90)
Glucose, Bld: 109 mg/dL — ABNORMAL HIGH (ref 70–99)
PHOSPHORUS: 3.6 mg/dL (ref 2.3–4.6)
POTASSIUM: 3.4 meq/L — AB (ref 3.7–5.3)
SODIUM: 154 meq/L — AB (ref 137–147)

## 2013-04-26 LAB — HEPARIN INDUCED THROMBOCYTOPENIA PNL
Heparin Induced Plt Ab: NEGATIVE
Patient O.D.: 0.244
UFH HIGH DOSE UFH H: 0 %
UFH LOW DOSE 0.5 IU/ML: 0 %
UFH Low Dose 0.1 IU/mL: 0 % Release
UFH SRA Result: NEGATIVE

## 2013-04-26 LAB — URINE MICROSCOPIC-ADD ON

## 2013-04-26 LAB — PREPARE RBC (CROSSMATCH)

## 2013-04-26 LAB — LACTATE DEHYDROGENASE: LDH: 269 U/L — AB (ref 94–250)

## 2013-04-26 LAB — DIRECT ANTIGLOBULIN TEST (NOT AT ARMC)
DAT, IGG: NEGATIVE
DAT, complement: NEGATIVE

## 2013-04-26 LAB — TSH: TSH: 2.183 u[IU]/mL (ref 0.350–4.500)

## 2013-04-26 LAB — HEMOGLOBIN AND HEMATOCRIT, BLOOD
HCT: 28 % — ABNORMAL LOW (ref 36.0–46.0)
HEMOGLOBIN: 9.2 g/dL — AB (ref 12.0–15.0)

## 2013-04-26 MED ORDER — POTASSIUM CHLORIDE 20 MEQ/15ML (10%) PO LIQD
40.0000 meq | Freq: Two times a day (BID) | ORAL | Status: AC
  Administered 2013-04-26 (×2): 40 meq via ORAL
  Filled 2013-04-26 (×3): qty 30

## 2013-04-26 MED ORDER — DIPHENHYDRAMINE HCL 50 MG/ML IJ SOLN
25.0000 mg | Freq: Once | INTRAMUSCULAR | Status: AC
  Administered 2013-04-26: 25 mg via INTRAVENOUS

## 2013-04-26 MED ORDER — ACETAMINOPHEN 325 MG PO TABS
650.0000 mg | ORAL_TABLET | ORAL | Status: DC | PRN
  Administered 2013-04-29: 650 mg via ORAL
  Filled 2013-04-26: qty 2

## 2013-04-26 MED ORDER — CALCIUM CARBONATE ANTACID 500 MG PO CHEW
2.0000 | CHEWABLE_TABLET | ORAL | Status: AC
  Filled 2013-04-26 (×2): qty 2

## 2013-04-26 MED ORDER — ACD FORMULA A 0.73-2.45-2.2 GM/100ML VI SOLN
500.0000 mL | Status: DC
  Administered 2013-04-27: 500 mL via INTRAVENOUS
  Filled 2013-04-26 (×2): qty 500

## 2013-04-26 MED ORDER — ANTICOAGULANT SODIUM CITRATE 4% (200MG/5ML) IV SOLN
5.0000 mL | Freq: Once | Status: AC
  Administered 2013-04-26: 5 mL
  Filled 2013-04-26: qty 250

## 2013-04-26 MED ORDER — SODIUM CHLORIDE 0.9 % IV SOLN
4.0000 g | Freq: Once | INTRAVENOUS | Status: AC
  Administered 2013-04-26: 4 g via INTRAVENOUS
  Filled 2013-04-26: qty 40

## 2013-04-26 MED ORDER — METHYLPREDNISOLONE SODIUM SUCC 125 MG IJ SOLR
125.0000 mg | Freq: Once | INTRAMUSCULAR | Status: AC
  Administered 2013-04-26: 125 mg via INTRAVENOUS

## 2013-04-26 MED ORDER — DIPHENHYDRAMINE HCL 25 MG PO CAPS: 25.0000 mg | ORAL_CAPSULE | Freq: Four times a day (QID) | ORAL | Status: DC | PRN

## 2013-04-26 MED ORDER — FAMOTIDINE IN NACL 20-0.9 MG/50ML-% IV SOLN
20.0000 mg | Freq: Once | INTRAVENOUS | Status: AC
  Administered 2013-04-26: 20 mg via INTRAVENOUS
  Filled 2013-04-26: qty 50

## 2013-04-26 MED ORDER — ACD FORMULA A 0.73-2.45-2.2 GM/100ML VI SOLN
Status: AC
  Administered 2013-04-26: 500 mL
  Filled 2013-04-26: qty 500

## 2013-04-26 NOTE — Progress Notes (Signed)
[place] ICU Electrolyte Replacement Protocol  Patient Name: Kaitlyn Valdez DOB: 1960-05-10 MRN: [id number]  Date of Service  04/26/2013   HPI/Events of Note    Recent Labs Lab 04/22/13 2105  04/23/13 1541  04/24/13 0313 04/24/13 0759 04/24/13 0805 04/25/13 0440 04/25/13 1309 04/26/13 0449  NA 139  < > 144  < > 153*  155* 157* 155* 152* 152* 154*  K 4.6  < > 3.5*  --  3.8  --  3.3* 3.0* 2.8* 3.4*  CL 101  < > 102  --  117*  --  116* 110 104 110  CO2 21  --  29  --  25  --   --  29  --  30  GLUCOSE 107*  < > 91  --  107*  --  111* 114* 112* 109*  BUN 26*  < > 30*  --  28*  --  26* 27* 27* 25*  CREATININE 2.49*  < > 2.67*  --  2.38*  --  2.40* 2.31* 2.10* 2.10*  CALCIUM 9.7  --  8.9  --  8.3*  --   --  8.2*  --  8.2*  MG 2.0  --   --   --   --   --   --   --   --   --   PHOS 5.4*  --  4.4  --  4.0  --   --  3.3  --  3.6  < > = values in this interval not displayed.  Estimated Creatinine Clearance: 27.6 ml/min (by C-G formula based on Cr of 2.1).  Intake/Output     01/29 0701 - 01/30 0700   P.O. 330   Total Intake(mL/kg) 330 (5.4)   Urine (mL/kg/hr) 300 (0.2)   Total Output 300   Net +30        - I/O DETAILED x24h    Total I/O In: 90 [P.O.:90] Out: -  - I/O THIS SHIFT    ASSESSMENT   eICURN Interventions  Electrolyte protocol criteria not met. Value not replaced by MD.   ASSESSMENT: MAJOR ELECTROLYTE    Lorene Dy 04/26/2013, 6:33 AM

## 2013-04-26 NOTE — Progress Notes (Signed)
CRITICAL VALUE ALERT  Critical value received:  Hemoglobin 6.7  Date of notification:  04/26/2013  Time of notification:  1530  Critical value read back:yes  Nurse who received alert:  Lamount CrankerKelli Hunt  MD notified (1st page):  Tyson AliasFeinstein  Time of first page:  1530  MD notified (2nd page):  Time of second page:  Responding MD:  Tyson AliasFeinstein  Time MD responded:  1530  Orders to transfuse 1 unit of PRBCs.

## 2013-04-26 NOTE — Progress Notes (Signed)
Vascular Ultrasound Transcranial Doppler has been completed.    04/26/2013 2:42 PM Gertie FeyMichelle Augustine Leverette, RVT, RDCS, RDMS

## 2013-04-26 NOTE — Progress Notes (Signed)
Kaitlyn Valdez   DOB:12/05/1960   WU#:[id number]    Subjective: The patient is alert and more oriented. Her speech dysarthria is noticeably improving. She still had left-sided hemiparesis. She appears more oriented today.  Objective:  Filed Vitals:   04/26/13 0700  BP: 145/79  Pulse: 61  Temp:   Resp: 20     Intake/Output Summary (Last 24 hours) at 04/26/13 0806 Last data filed at 04/25/13 2005  Gross per 24 hour  Intake    330 ml  Output    300 ml  Net     30 ml    GENERAL:alert, no distress and comfortable SKIN: skin color, texture, turgor are normal, no rashes or significant lesions EYES: normal, Conjunctiva are pink and non-injected, sclera clear OROPHARYNX:no exudate, no erythema and lips, buccal mucosa, and tongue normal  NECK: supple, thyroid normal size, non-tender, without nodularity LYMPH:  no palpable lymphadenopathy in the cervical, axillary or inguinal LUNGS: clear to auscultation and percussion with normal breathing effort HEART: regular rate & rhythm and no murmurs and no lower extremity edema ABDOMEN:abdomen soft, non-tender and normal bowel sounds Musculoskeletal:no cyanosis of digits and no clubbing  NEURO: alert & oriented x 3 with mild dysarthria, with persistent left-sided weakness  Labs:  Lab Results  Component Value Date   WBC 7.3 04/26/2013   HGB 8.6* 04/26/2013   HCT 25.6* 04/26/2013   MCV 92.8 04/26/2013   PLT 95* 04/26/2013   NEUTROABS 3.8 04/22/2013    Lab Results  Component Value Date   NA 154* 04/26/2013   K 3.4* 04/26/2013   CL 110 04/26/2013   CO2 30 04/26/2013   ADAMTS13 level came back low LDH is improving to 269 Assessment & Plan:  #1 Severe thrombocytopenia due to TTP This is a lady with reportedly background history of TTP and left-sided weakness, have evidence of severe thrombocytopenia, evidence of acute renal failure and elevated bilirubin as well as LDH, suspect TTP. The patient also have mild low-grade fever on admission. ADAMTS13  level is low Plasma pheresis has been initiated on 2/27 with improvement of her platelet count I recommend to continue treatment daily until her platelet count normalized (>140) before initiation of tapering of plasmapheresis #2 anemia  This could be due to hemolysis. Haptoglobin were low. She received blood transfusion on 1/28. Her hemoglobin today is stable #3 acute renal failure  The patient will proceed with plasmapheresis as above  #4 history of stroke  I recommend not to restart her on Plavix in the future due to risk of TTP with Plavix which has been reported. Now that her mental status and confusion has improved, I suspect the cause of the stroke is likely due to TTP. Now that her platelet count has improved, I recommend consider starting her on aspirin #5 hypernatremia Would defer to her nephrologist for adjustment  I will return to followup with the patient next week. I have discussed with Dr. Eliott Nineunham (nephrologist) who has ordered plasmapheresis every day through the weekend.    Ora Mcnatt, MD 04/26/2013  8:06 AM

## 2013-04-26 NOTE — Progress Notes (Signed)
Speech Language Pathology Treatment: Dysphagia  Patient Details Name: Kaitlyn Valdez Defenbaugh MRN: [id number] DOB: 07/19/1960 Today's Date: 04/26/2013 Time: 1442-1500 SLP Time Calculation (min): 18 min  Assessment / Plan / Recommendation Clinical Impression  Pt. Alert, pleasant and impulsive seen for safety with nectar thick and differential diagnosis with thin liquids.  Pt. consumed single sips thin liquid, hand over hand assist with max verbal/tactile cues for small sips without cough, throat clear or wet vocal quality.  Buttermilk (nectar thick) consumed without difficulty.  Swallow function appears to be improving, however would not recommend thin liquids at present with cognitive deficits with significant impulsivity and decreased awareness.  Continue Dys 1 and nectar thick with ST continuing for safety and upgrade when able.  Pt. Needs a full cognitive assessment as well.  MD, please order if agree.      HPI HPI:  53 yo with past medical history of CVA and baseline L side neglect found down at home, covered in emesis, confused with slurred speech and worsen L sided neglect.  Workup revealed thrombocytopenia.  Head CT revealed no acute findings but multiple prior infarcts. [facility]  Head CT revealed no acute findings but multiple prior infarcts. [facility]. [facility] revealed large nonhemorrhagic acute R MCA infarct.    Pertinent Vitals WDL  SLP Plan  Continue with current plan of care    Recommendations Diet recommendations: Dysphagia 1 (puree);Nectar-thick liquid Liquids provided via: Cup;No straw Medication Administration: Whole meds with puree Supervision: Patient able to self feed;Full supervision/cueing for compensatory strategies Compensations: Slow rate;Small sips/bites Postural Changes and/or Swallow Maneuvers: Seated upright 90 degrees              General recommendations: Rehab consult Oral Care Recommendations:  Oral care BID Follow up Recommendations: Inpatient Rehab Plan: Continue with current plan of care    GO     Royce MacadamiaLisa Willis Alexandros Ewan M.Ed [employer]SLP Pager [phone]  04/26/2013

## 2013-04-26 NOTE — Progress Notes (Addendum)
[facility] Progress Note   Subjective:   Pt w/ no complaints today, denies fever or chills or CP    Objective:   BP 145/79  Pulse 61  Temp(Src) 99.2 F (37.3 C) (Oral)  Resp 20  Ht 5\' 5"  (1.651 m)  Wt 133 lb 13.1 oz (60.7 kg)  BMI 22.27 kg/m2  SpO2 96%  Intake/Output Summary (Last 24 hours) at 04/26/13 0735 Last data filed at 04/25/13 2005  Gross per 24 hour  Intake    330 ml  Output    300 ml  Net     30 ml   Weight change: 1 lb 12.2 oz (0.8 kg)  Physical Exam: Gen:NAD, in bed Left IJ temp cath (1/27) Cardio: RRR, no murmurs appreciated  Resp:CTAB, no wheezing ZOX:WRUE/AV/WU, no CVA tendernes Ext: no edema B/L  Neuro:  Awake, alert, not oriented, dysarthria w/ L sided neglect   Imaging: No results found.  Labs: BMET  Recent Labs Lab 04/22/13 2105 04/23/13 0656 04/23/13 1541 04/23/13 2100 04/24/13 0313 04/24/13 0759 04/24/13 0805 04/25/13 0440 04/25/13 1309 04/26/13 0449  NA 139 142 144 149* 153*  155* 157* 155* 152* 152* 154*  K 4.6 3.9 3.5*  --  3.8  --  3.3* 3.0* 2.8* 3.4*  CL 101 104 102  --  117*  --  116* 110 104 110  CO2 21  --  29  --  25  --   --  29  --  30  GLUCOSE 107* 102* 91  --  107*  --  111* 114* 112* 109*  BUN 26* 33* 30*  --  28*  --  26* 27* 27* 25*  CREATININE 2.49* 3.00* 2.67*  --  2.38*  --  2.40* 2.31* 2.10* 2.10*  CALCIUM 9.7  --  8.9  --  8.3*  --   --  8.2*  --  8.2*  PHOS 5.4*  --  4.4  --  4.0  --   --  3.3  --  3.6   CBC  Recent Labs Lab 04/22/13 2105  04/24/13 2200 04/25/13 0543 04/25/13 1309 04/25/13 1600 04/26/13 0449  WBC 6.4  < > 6.5 6.2  --  7.7 7.3  NEUTROABS 3.8  --   --   --   --   --   --   HGB 11.6*  < > 8.0* 8.1* 12.9 8.2* 8.6*  HCT 33.4*  < > 23.6* 24.1* 38.0 24.8* 25.6*  MCV 91.0  < > 91.8 92.3  --  92.5 92.8  PLT 10*  < > 49* 61*  --  79* 95*  < > = values in this interval not displayed.  Medications:    . anticoagulant sodium citrate  5 mL Intracatheter Once  . calcium  carbonate  2 tablet Oral Q3H  . calcium gluconate IVPB  4 g Intravenous Once  . calcium gluconate IVPB  4 g Intravenous Once  . calcium gluconate  2 g Intravenous Once  . feeding supplement (ENSURE)  1 Container Oral TID BM  . pantoprazole (PROTONIX) IV  40 mg Intravenous Q24H  . simvastatin  20 mg Oral q1800      Assessment/ Plan:   Pt is a 53 y/o FM w/ PMHx of CVA w/ baseline L sided neglect found down at home with confusion and slurred speech w/ thrombocytopenia, hemolytic anemia, AKI, fever, and confusion dx with TTP.  1) TTP with AKI, resolving - Pt with pentad consistent with TTP secondary  to most likely Plavix. ADAMTS 13 =  12, consistent with TTP . LDH trending down.  NO MORE PLAVIX.  2) H/O of CVA with L sided Neglect 3) HTN 4) Hyperlipidemia  5) Hypernatremia - intentional per neuro using hypertonic saline so will defer further mgmt to them 6)Bradycardia.  Could be secondary to TTP which can cause arrhthymias and acute MI's. Troponin elevated x 1 (0.34)   Plan: 1) Continue Plasmaspheresis as needed, will defer to hematology, appreciate recommendations. (Today is Day #4) 2) Trend daily creatinine, I/O, BP's 3) MRI/MRA or CT head w/o contrast per neurology  4) F/U CBC for platelets and LDH  5) Continue to trend Na now off of hypertonic saline  6) Consider Cardiology consult for elevated troponin, bradycardia in setting of TTP   Briscoe DeutscherBryan R Hess, DO PGY-2, [facility] 04/26/2013, 7:35 AM I have seen and examined this patient and agree with plan as outlined in the above note. Hematologic parameters improved with plts up to 95K, LDH 269, Hb 8.6, creatinine down to 2.1 with unknown baseline.  Still using temp cath.  Has orders written through Sunday. Spoke with Dr. Bertis RuddyGorsuch this AM who will make recommendations regarding future pheresis plan on Monday.  If the plan is for an every other day program or outpt program for any period of time, would need a tunnelled catheter.   Heme needs assistance with writing orders which we can do as long as in the inpt environment.  Will continue to follow.  Sanyah Molnar B,MD 04/26/2013 9:35 AM

## 2013-04-26 NOTE — Care Management Note (Unsigned)
    Page 1 of 1   04/29/2013     4:52:58 PM   CARE MANAGEMENT NOTE 04/29/2013  Patient:  [name],Ruthell T   Account Number:  [id number]  Date Initiated:  04/23/2013  Documentation initiated by:  Avie ArenasBROWN,SARAH  Subjective/Objective Assessment:   Transferred to [facility] for plasmaphoresis for TTP     Action/Plan:   Anticipated DC Date:  04/29/2013   Anticipated DC Plan:  HOME W HOME HEALTH SERVICES      DC Planning Services  CM consult      Choice offered to / List presented to:             Status of service:  In process, will continue to follow Medicare Important Message given?   (If response is "NO", the following Medicare IM given date fields will be blank) Date Medicare IM given:   Date Additional Medicare IM given:    Discharge Disposition:    Per UR Regulation:  Reviewed for med. necessity/level of care/duration of stay  If discussed at Long Length of Stay Meetings, dates discussed:   04/30/2013    Comments:  Contact:  Speegle,Barbara Sister [phone]  04-29-13 [address] [place], [place] [phone]  Pt's last plasmapheresis. 04-28-13. Plan for surgery to palce Diatek cath for daily outpatient plasmapheresis. CIR has been consulted. CM will continue to monitor for disposition needs.   04-26-13 2:00pm Avie ArenasSarah Brown, RNBSN - [phone] On plasmaphoresis. No family in room at this time.  PT/OT ordered.   Talked with sister, Britta MccreedyBarbara listed above. Patient prior to admisson was independent and lived with this sister, her husband and two other brothers.  All take care of each other.  Understands that prior to return home patient will need rehab.  States patient has a daughter [place] in [place], but beings siblings are closer she was listed as contact.  Siblings and daughter get along fine so informed me that we can call her or daughter if need to.  CM will continue to follow.

## 2013-04-26 NOTE — Plan of Care (Signed)
Problem: Consults Goal: Ischemic Stroke Patient Education See Patient Education Module for education specifics. Outcome: Not Progressing Patient remains confused, only A&Ox1 to self; unable to educate at this time. Will continue to monitor and advance when appropriate.

## 2013-04-26 NOTE — Progress Notes (Signed)
Name: Kaitlyn Valdez MRN: [id number] DOB: 01/16/1961    ADMISSION DATE:  04/22/2013 CONSULTATION DATE: 04/22/2013   REFERR[facility] MD : [facility]   PRIMARY SERVICE: PCCM   CHIEF COMPLAINT: Acute encephalopathy   BRIEF PATIENT DESCRIPTION: 53 yo with past medical history of CVA and baseline L sided weakness found down at home, covered in emesis, confused with slurred speech and worsened L sided weakness. Workup revealed acute relapse of TTP. Head CT revealed no acute findings but multiple prior infarcts. [facility]. [facility] revealed large nonhemorrhagic acute R MCA infarct.   SIGNIFICANT EVENTS / STUDIES:  1/26 Found down, low platelets  1/26 Head CT [facility] >>> no acute abnormality, multiple old infarcts  1/26 [facility]  1/27 TPE 1/27 Renal u/s - No masses / hydro. Medical renal disease  1/27 CT head - Large nonhemorrhagic acute right middle cerebral artery distribution infarct with slight compression of the right lateral ventricle without midline shift. Patient is at risk for development of further mass effect/hemorrhage as the infarct evolves.  1/28 TPE  1/28 MRI brain - brady cant do  1/29 CT head - brady to unstable for this  1/29 TPE 1/30 improved HR  LINES / TUBES:  Trialysis catheter 1/26 >>>  PIV 1/26 >>>  Foley 1/27>>>   CULTURES:  1/26 Blood >>>  1/26 Urine >>> negative   ANTIBIOTICS:  none   SUBJECTIVE:  Agitation at baseline. Resolved brady  Waist belt and restraints removed. Could not tolerate MRI yesterday. Will have repeat CT head today if able No urine after foley removed  VITAL SIGNS: Temp:  [98.6 F (37 C)-100.2 F (37.9 C)] 99.2 F (37.3 C) (01/30 0353) Pulse Rate:  [54-92] 66 (01/30 0600) Resp:  [17-32] 19 (01/30 0600) BP: (117-144)/(57-100) 144/100 mmHg (01/30 0600) SpO2:  [88 %-98 %] 97 % (01/30 0600) Weight:  [60.7 kg (133 lb 13.1 oz)] 60.7 kg (133 lb 13.1 oz) (01/30  0500) HEMODYNAMICS:   VENTILATOR SETTINGS:   INTAKE / OUTPUT: Intake/Output     01/29 0701 - 01/30 0700 01/30 0701 - 01/31 0700   P.O. 330    Blood     Total Intake(mL/kg) 330 (5.4)    Urine (mL/kg/hr) 300 (0.2)    Total Output 300     Net +30            PHYSICAL EXAMINATION: General: Alert, follows simple commands, oriented to name and president  HEENT: [place]/AT, dry membranes, left facial droop  Cardiovascular: regular rhythm, tachycardic, S1S2 present, no murmur noted  Lungs: CTAB, no wheezing  Abdomen: Soft, nontender, bowel sounds diminished  Musculoskeletal: Moves 3/4 extremities spontaneously (no left arm), no edema  Extremities: cold to touch left foot/leg  Neuro: Encephalopathic, with L sided neglect, cough / gag present. RUE 5/5, LUE 0/5, LLE 4/5, RLE 5/5  Skin: Petechiae improving  LABS:  CBC  Recent Labs Lab 04/25/13 0543 04/25/13 1309 04/25/13 1600 04/26/13 0449  WBC 6.2  --  7.7 7.3  HGB 8.1* 12.9 8.2* 8.6*  HCT 24.1* 38.0 24.8* 25.6*  PLT 61*  --  79* 95*   Coag's  Recent Labs Lab 04/22/13 2105  APTT 36  INR 1.11   BMET  Recent Labs Lab 04/24/13 0313  04/25/13 0440 04/25/13 1309 04/26/13 0449  NA 153*  155*  < > 152* 152* 154*  K 3.8  < > 3.0* 2.8* 3.4*  CL 117*  < >  110 104 110  CO2 25  --  29  --  30  BUN 28*  < > 27* 27* 25*  CREATININE 2.38*  < > 2.31* 2.10* 2.10*  GLUCOSE 107*  < > 114* 112* 109*  < > = values in this interval not displayed. Electrolytes  Recent Labs Lab 04/22/13 2105  04/24/13 0313 04/25/13 0440 04/26/13 0449  CALCIUM 9.7  < > 8.3* 8.2* 8.2*  MG 2.0  --   --   --   --   PHOS 5.4*  < > 4.0 3.3 3.6  < > = values in this interval not displayed. Sepsis Markers No results found for this basename: LATICACIDVEN, PROCALCITON, O2SATVEN,  in the last 168 hours ABG No results found for this basename: PHART, PCO2ART, PO2ART,  in the last 168 hours Liver Enzymes  Recent Labs Lab 04/22/13 2105  04/24/13 0313  04/25/13 0440 04/26/13 0449  AST 54*  --   --  39*  --   ALT 28  --   --  21  --   ALKPHOS 70  --   --  65  --   BILITOT 1.9*  --   --  0.7  --   ALBUMIN 4.0  < > 3.1* 3.4* 3.2*  < > = values in this interval not displayed. Cardiac Enzymes  Recent Labs Lab 04/25/13 1010 04/25/13 1610 04/25/13 2135  TROPONINI 0.34* <0.30 <0.30   Glucose  Recent Labs Lab 04/24/13 0355 04/24/13 0806 04/24/13 1148 04/24/13 1547 04/24/13 1914 04/24/13 2359  GLUCAP 105* 124* 121* 180* 171* 106*    Imaging No results found.   CXR 1/28 - no defined infiltrate   ASSESSMENT / PLAN:   NEUROLOGIC  A:  Acute R MCA infarct - L sided neglect  Acute encephalopathy  Multiple previous CVAs, r/o emboli  ECHO - 60-65%, no cardiac source of emboli identified  P:  Neuro following   CT head pending - unable due to bradycardia - try again today  Carotid, transcranial dopplers pending  Resume pravachol 40mg  daily when able to swallow  [place] records obtained and in chart  PT/OT consult  PULMONARY  A:  No respiratory distress  P:  Goal SpO2>92  Supplemental oxygen as needed pcxr in am for atx Suction as needed  CARDIOVASCULAR  A:  Bradycardia asymptomatic etiology: cushings response? resolved R/o ischemia - trops negative, EKG WNL Hx HTN  Hx dyslipidemia  ECHO - 60-65% EF, No wall motion abnl.  P:  Hold Valsartan Resume statin Plavix stopped indefinitely  Monitor tele  Atropine if further bradycardia at bedside, not required  Pacer pads on chest  No dop required tele  RENAL  A:  CKDIII (baseline SCr 1.7-2) (records obtained from [place])  Renal u/s - no mass / hydro  Hypokalemia  Hypernatremia P:  Trend BMP No free water  Nephrology following  Daily TPE until PLTC normalized per hematology  [place] records obtained and in chart   GASTROINTESTINAL  A:  Nutrition  GI Px  Moderate Aspiration Risk  P:  Dysphagia 1 (puree) nectar thick per SLP  Protonix for GI Px     HEMATOLOGIC  A:  TTP>>ADAMTS13 = 12%, plavix discontinued  Anemia - s/p 1uPRBC - Hbg trending up  Hyperbilirubinemia - resolved  VTE Px  LDH trending down  Would ensure no heparin exposure, No HITT with associated cva  P:  HIT panel pending (may take up to 1 week) unlikley , but with cva reasonable to  r/o  Trend CBC  SCDs   Hematology following, continue TPE until PLTC > 140,000 [facility] hospital records obtained and in chart   INFECTIOUS  A:  Low grade fever  No leukocytosis  P:  Follow cultures  Trend fever curve  pcxr again in am  ENDOCRINE  A:  No active isses  A1C 4.8  P:  Check TSH   TODAY'S SUMMARY:  Continue TPE, brady resolved, for CT, to tele, triad  I have personally obtained a history, examined the patient, evaluated laboratory and imaging results, formulated the assessment and plan and placed orders.  Mcarthur Rossetti. Tyson Alias, MD, FACP Pgr: [phone] [facility]  Pulmonary and Critical Care Medicine [facility] Pager: [phone]  04/26/2013, 7:08 AM

## 2013-04-26 NOTE — Progress Notes (Signed)
Stroke Team Progress Note  HISTORY Kaitlyn Valdez is an 53 y.o. female with history of stroke that affected her RIGHT side per family member who are at bedside. Daughter states she had been talking to her on the phone between the hours of 9-10 AM and she was speaking normally. Her aunt found patient around 2311 AM 04/21/2013 down at home, and per note she was confused, showed slurred speech and lever left sided neglect. Family at bedside state the left sided neglect is new. [facility] due to PLT count 22 and concern for TTP. TPE was initiated and [facility]. Neurology was consulted concerning possibility of adding Plavix to TPE for embolic prevention.  Patient was not administerd TPA secondary to PLT count of 10. [facility] and [facility].  SUBJECTIVE Her family is not  at the bedside.  Overall she feels her condition is gradually improving. . No new issues  OBJECTIVE Most recent Vital Signs: Filed Vitals:   04/26/13 0400 04/26/13 0500 04/26/13 0600 04/26/13 0700  BP: 143/78 144/79 144/100 145/79  Pulse: 90 60 66 61  Temp:      TempSrc:      Resp: 23 25 19 20   Height:      Weight:  60.7 kg (133 lb 13.1 oz)    SpO2: 89% 98% 97% 96%   CBG (last 3)   Recent Labs  04/24/13 1547 04/24/13 1914 04/24/13 2359  GLUCAP 180* 171* 106*    IV Fluid Intake:   . citrate dextrose    . citrate dextrose      MEDICATIONS  . anticoagulant sodium citrate  5 mL Intracatheter Once  . calcium carbonate  2 tablet Oral Q3H  . calcium gluconate IVPB  4 g Intravenous Once  . calcium gluconate IVPB  4 g Intravenous Once  . calcium gluconate  2 g Intravenous Once  . feeding supplement (ENSURE)  1 Container Oral TID BM  . pantoprazole (PROTONIX) IV  40 mg Intravenous Q24H  . simvastatin  20 mg Oral q1800   PRN:  acetaminophen, acetaminophen, diphenhydrAMINE, diphenhydrAMINE, diphenhydrAMINE,  haloperidol lactate, ondansetron (ZOFRAN) IV, RESOURCE THICKENUP CLEAR  Diet:  Dysphagia 1 nectar thick liquids Activity:  Bedrest DVT Prophylaxis:  SCDs   CLINICALLY SIGNIFICANT STUDIES Basic Metabolic Panel:  Recent Labs Lab 04/22/13 2105  04/25/13 0440 04/25/13 1309 04/26/13 0449  NA 139  < > 152* 152* 154*  K 4.6  < > 3.0* 2.8* 3.4*  CL 101  < > 110 104 110  CO2 21  < > 29  --  30  GLUCOSE 107*  < > 114* 112* 109*  BUN 26*  < > 27* 27* 25*  CREATININE 2.49*  < > 2.31* 2.10* 2.10*  CALCIUM 9.7  < > 8.2*  --  8.2*  MG 2.0  --   --   --   --   PHOS 5.4*  < > 3.3  --  3.6  < > = values in this interval not displayed. Liver Function Tests:  Recent Labs Lab 04/22/13 2105  04/25/13 0440 04/26/13 0449  AST 54*  --  39*  --   ALT 28  --  21  --   ALKPHOS 70  --  65  --   BILITOT 1.9*  --  0.7  --   PROT 7.4  --  5.9*  --   ALBUMIN 4.0  < >  3.4* 3.2*  < > = values in this interval not displayed. CBC:  Recent Labs Lab 04/22/13 2105  04/25/13 1600 04/26/13 0449  WBC 6.4  < > 7.7 7.3  NEUTROABS 3.8  --   --   --   HGB 11.6*  < > 8.2* 8.6*  HCT 33.4*  < > 24.8* 25.6*  MCV 91.0  < > 92.5 92.8  PLT 10*  < > 79* 95*  < > = values in this interval not displayed. Coagulation:  Recent Labs Lab 04/22/13 2105  LABPROT 14.1  INR 1.11   Cardiac Enzymes:  Recent Labs Lab 04/25/13 1010 04/25/13 1610 04/25/13 2135  TROPONINI 0.34* <0.30 <0.30   Urinalysis:  Recent Labs Lab 04/23/13 1157  COLORURINE AMBER*  LABSPEC 1.021  PHURINE 6.5  GLUCOSEU NEGATIVE  HGBUR LARGE*  BILIRUBINUR SMALL*  KETONESUR 15*  PROTEINUR >300*  UROBILINOGEN 1.0  NITRITE NEGATIVE  LEUKOCYTESUR NEGATIVE   Lipid Panel    Component Value Date/Time   CHOL 122 04/24/2013 0313   TRIG 99 04/24/2013 0313   HDL 37* 04/24/2013 0313   CHOLHDL 3.3 04/24/2013 0313   VLDL 20 04/24/2013 0313   LDLCALC 65 04/24/2013 0313   HgbA1C  Lab Results  Component Value Date   HGBA1C 4.8 04/23/2013     Urine Drug Screen:   No results found for this basename: labopia, cocainscrnur, labbenz, amphetmu, thcu, labbarb    Alcohol Level: No results found for this basename: ETH,  in the last 168 hours  No results found.  CT of the brain  04/24/2013  04/23/2013 Exam is motion degraded. Large nonhemorrhagic acute right middle cerebral artery distribution infarct with slight compression of the right lateral ventricle without midline shift. Patient is at risk for development of further mass effect/hemorrhage as the infarct evolves. Remote infarct with encephalomalacia left posterior temporal -parietal lobe.   MRI of the brain    MRA of the brain    2D Echocardiogram  EF 60-65% with no source of embolus.   Carotid Doppler  Bilateral: 1-39% ICA stenosis. Vertebral artery flow is antegrade.   TCD   CXR   04/24/2013 1. Slightly increased pulmonary vascular congestion without evidence of pulmonary edema. 2. Stable position of left IJ hemodialysis catheter.  04/22/2013 Left IJ dialysis catheter tip mid SVC level No acute finding   EKG  Sinus bradycardia with short PR. Nonspecific ST abnormality  Therapy Recommendations  pending  Physical Exam   Elderly caucasian lady not in distress. Afebrile. Head is nontraumatic. Neck is supple without bruit. Cardiac exam no murmur or gallop. Lungs are clear to auscultation. Distal pulses are well felt.  Neurological Exam : Awake alert. Right gaze preference but can look up to the left till midline. Does not blink to threat on the left but does so on the right. Pupils 4 mm irregular but sluggishly reactive. Fundi were not visualized. Speech is dysarthric and she is disoriented and confused and distractible. Follows simple one-step commands only. Tongue is midline. Left lower facial weakness. Left hemiparesis but will withdraw left upper and lower extremity to painful stimuli. Purposeful movements and right side against gravity. Left plantar is upgoing right is  downgoing. Diminished sensation on the left and significant left hemineglect. Gait was not tested.   ASSESSMENT Ms. Kaitlyn Valdez is a 53 y.o. female presenting with confusion, slurred speech, and left sided neglect. Imaging confirms a right wedge shaped MCA infarct. Infarct felt to be embolic given 3 prior infarcts in  the left brain. Such large vessel infarcts unlikely to be associated with TTP which typically causes microangiopathy, especially recurrent strokes. Other differentials include: cardioembolic source, primary hypercoagulable source, etc. On clopidogrel 75 mg orally every day prior to admission. Now on no antithrombotics for secondary stroke prevention. Patient with resultant confusion, dysarthria, neurologic neglect, dysphagia. Work up underway.   Recurrent TTP , undergoing plasmaphoresis   Severe anemia   Acute Renal failure, Cr 2.4     Induced hypernatremia in order to decrease cerebral edema, Na 154 this am, protocol stopped 04/24/2013, Na drifting down slowly   Hypertension   Hyperlipidemia, LDL 65, on pravachol 40 mg daily PTA, now on no statin as NPO, goal LDL < 100 (< 70 for diabetics)   Hx strokes - baseline L HP per family suggests old right brain stroke; old left brain strokes seen on imaging   Depression  Hospital day # 4  TREATMENT/PLAN  Transfer to step down  No antithrombotics at this time for secondary stroke prevention due to TTP   Await transcranial Doppler results . She will not tolerate MRI and cannot do CT angio due to renal failure  F/u CT head results - still pending   Resume statin ( Pravachol 40 mg daily prior to admission )   Await further records from outside facility  TCD unsuccessful- plan to repeat today  I have personally obtained a history, examined the patient, evaluated imaging results, and formulated the assessment and plan of care. I agree with the above.

## 2013-04-26 NOTE — Progress Notes (Signed)
UR Completed.  Kaitlyn Valdez, Kaitlyn Valdez [phone] 04/26/2013

## 2013-04-26 NOTE — Progress Notes (Signed)
Pt finishing plasmapheresis. RN called to bedside. Pt anxious with HR in the 150s, skin bright red and warm to touch, temp 99.4, and BP 175/86. Dr. Tyson AliasFeinstein called to bedside. MD placed new orders for IV benadryl, solumedrol, and pepcid for probable acute transfusion reaction. Labs ordered. Will continue to monitor pt.

## 2013-04-27 ENCOUNTER — Inpatient Hospital Stay (HOSPITAL_COMMUNITY): Payer: Medicare HMO

## 2013-04-27 LAB — THERAPEUTIC PLASMA EXCHANGE (BLOOD BANK)
Plasma Exchange: 3913
Plasma volume needed: 3900
UNIT DIVISION: 0
UNIT DIVISION: 0
Unit division: 0
Unit division: 0
Unit division: 0
Unit division: 0
Unit division: 0
Unit division: 0
Unit division: 0
Unit division: 0

## 2013-04-27 LAB — CBC
HCT: 26.9 % — ABNORMAL LOW (ref 36.0–46.0)
HCT: 23.9 % — ABNORMAL LOW (ref 36.0–46.0)
Hemoglobin: 9 g/dL — ABNORMAL LOW (ref 12.0–15.0)
Hemoglobin: 7.8 g/dL — ABNORMAL LOW (ref 12.0–15.0)
MCH: 31.3 pg (ref 26.0–34.0)
MCH: 31 pg (ref 26.0–34.0)
MCHC: 33.5 g/dL (ref 30.0–36.0)
MCHC: 32.6 g/dL (ref 30.0–36.0)
MCV: 93.4 fL (ref 78.0–100.0)
MCV: 94.8 fL (ref 78.0–100.0)
PLATELETS: 110 10*3/uL — AB (ref 150–400)
Platelets: 131 10*3/uL — ABNORMAL LOW (ref 150–400)
RBC: 2.88 MIL/uL — ABNORMAL LOW (ref 3.87–5.11)
RBC: 2.52 MIL/uL — ABNORMAL LOW (ref 3.87–5.11)
RDW: 15.9 % — AB (ref 11.5–15.5)
RDW: 15.7 % — AB (ref 11.5–15.5)
WBC: 5.7 10*3/uL (ref 4.0–10.5)
WBC: 7 10*3/uL (ref 4.0–10.5)

## 2013-04-27 LAB — POCT I-STAT, CHEM 8
BUN: 22 mg/dL (ref 6–23)
CREATININE: 1.9 mg/dL — AB (ref 0.50–1.10)
Calcium, Ion: 0.62 mmol/L — CL (ref 1.12–1.23)
Chloride: 103 mEq/L (ref 96–112)
Glucose, Bld: 91 mg/dL (ref 70–99)
HEMATOCRIT: 26 % — AB (ref 36.0–46.0)
HEMOGLOBIN: 8.8 g/dL — AB (ref 12.0–15.0)
Potassium: 3.4 mEq/L — ABNORMAL LOW (ref 3.7–5.3)
SODIUM: 153 meq/L — AB (ref 137–147)
TCO2: 27 mmol/L (ref 0–100)

## 2013-04-27 LAB — RENAL FUNCTION PANEL
Albumin: 3.5 g/dL (ref 3.5–5.2)
BUN: 21 mg/dL (ref 6–23)
CALCIUM: 8.5 mg/dL (ref 8.4–10.5)
CO2: 26 mEq/L (ref 19–32)
CREATININE: 1.83 mg/dL — AB (ref 0.50–1.10)
Chloride: 110 mEq/L (ref 96–112)
GFR calc Af Amer: 35 mL/min — ABNORMAL LOW (ref >90)
GFR, EST NON AFRICAN AMERICAN: 30 mL/min — AB (ref >90)
Glucose, Bld: 117 mg/dL — ABNORMAL HIGH (ref 70–99)
PHOSPHORUS: 3.9 mg/dL (ref 2.3–4.6)
Potassium: 4 mEq/L (ref 3.7–5.3)
Sodium: 151 mEq/L — ABNORMAL HIGH (ref 137–147)

## 2013-04-27 LAB — LACTATE DEHYDROGENASE: LDH: 285 U/L — AB (ref 94–250)

## 2013-04-27 LAB — TYPE AND SCREEN
ABO/RH(D): A POS
Antibody Screen: NEGATIVE
UNIT DIVISION: 0

## 2013-04-27 MED ORDER — CLONAZEPAM 0.5 MG PO TABS
0.5000 mg | ORAL_TABLET | Freq: Once | ORAL | Status: AC
  Administered 2013-04-27: 0.5 mg via ORAL
  Filled 2013-04-27: qty 1

## 2013-04-27 MED ORDER — PANTOPRAZOLE SODIUM 40 MG PO TBEC
40.0000 mg | DELAYED_RELEASE_TABLET | Freq: Every day | ORAL | Status: DC
  Administered 2013-04-27 – 2013-04-30 (×4): 40 mg via ORAL
  Filled 2013-04-27 (×5): qty 1

## 2013-04-27 MED ORDER — ACETAMINOPHEN 325 MG PO TABS: 650.0000 mg | ORAL_TABLET | ORAL | Status: DC | PRN

## 2013-04-27 MED ORDER — DIPHENHYDRAMINE HCL 25 MG PO CAPS: 25.0000 mg | ORAL_CAPSULE | Freq: Four times a day (QID) | ORAL | Status: DC | PRN

## 2013-04-27 MED ORDER — SODIUM CHLORIDE 0.9 % IV SOLN
4.0000 g | Freq: Once | INTRAVENOUS | Status: AC
  Administered 2013-04-27: 4 g via INTRAVENOUS
  Filled 2013-04-27 (×2): qty 40

## 2013-04-27 MED ORDER — ACD FORMULA A 0.73-2.45-2.2 GM/100ML VI SOLN
500.0000 mL | Status: DC
  Filled 2013-04-27: qty 500

## 2013-04-27 MED ORDER — ANTICOAGULANT SODIUM CITRATE 4% (200MG/5ML) IV SOLN
5.0000 mL | Freq: Once | Status: AC
  Administered 2013-04-27: 2.8 mL
  Filled 2013-04-27 (×2): qty 250

## 2013-04-27 MED ORDER — CALCIUM CARBONATE ANTACID 500 MG PO CHEW
2.0000 | CHEWABLE_TABLET | ORAL | Status: AC
  Administered 2013-04-27: 400 mg via ORAL
  Filled 2013-04-27 (×2): qty 2

## 2013-04-27 NOTE — Progress Notes (Signed)
[facility] Progress Note   Subjective:   For PRBC's today for Hb 6.7 Developed fever, HTN, anxiety at end of PLEX yesterday - Dr. Tyson Alias ordered steroids, benadryl and pepcid for possible transfusion rxn Platelets continue to improve - today 110K and LDH down to 285 Renal function also improved creatinine 2.1  For 5th plasma exchange today.    Objective:   BP 167/94  Pulse 50  Temp(Src) 98.8 F (37.1 C) (Oral)  Resp 18  Ht 5\' 5"  (1.651 m)  Wt 64.411 kg (142 lb)  BMI 23.63 kg/m2  SpO2 100%  Intake/Output Summary (Last 24 hours) at 04/27/13 9811 Last data filed at 04/27/13 9147  Gross per 24 hour  Intake  212.5 ml  Output   1675 ml  Net -1462.5 ml   Weight change:   Physical Exam: Gen:NAD, in bed Left IJ temp cath (1/27) Cardio: RRR, no murmurs appreciated  Resp:CTAB, no wheezing WGN:FAOZ/HY/QM, no CVA tendernes Ext: no edema B/L  Neuro:  Awake, alert, not oriented, dysarthria w/ L sided neglect; unsure of orientation but does ask about her platelets  Imaging: Ct Head Wo Contrast  04/26/2013   CLINICAL DATA:  Follow-up right middle cerebral artery distribution stroke.  EXAM: CT HEAD WITHOUT CONTRAST  TECHNIQUE: Contiguous axial images were obtained from the base of the skull through the vertex without intravenous contrast.  COMPARISON:  CT HEAD W/O CM dated 04/23/2013  FINDINGS: Further evolution of the large right middle cerebral artery distribution stroke, with involvement of the right temporal, frontal and parietal lobes. Minimal petechial hemorrhage at the anterior extent of the stroke in the posterior right frontal lobe consistent with rib bloods reperfusion. Mass effect with effacement of cortical sulci in the right cerebral hemisphere, slight compression of the right lateral ventricle, and slight shift of the midline to the left approximating 2-3 mm. No evidence of uncal or transtentorial herniation.  Encephalomalacia involving the left frontal lobe,  left parietal lobe, and left occipital lobe, related to old cortical strokes. No evidence of hydrocephalus. No new intracranial abnormality elsewhere.  No focal osseous abnormality involving the skull. Mucous retention cyst or polyp in the left maxillary sinus and minimal mucosal thickening involving the left sphenoid sinus, unchanged. Remaining paranasal sinuses, bilateral mastoid air cells and bilateral middle ear cavities well aerated. Bilateral carotid siphon atherosclerosis.  IMPRESSION: 1. Further evolution of the large right middle cerebral artery distribution stroke, with minimal petechial hemorrhage at the anterior extent of the infarct in the posterior left frontal lobe as a result of luxury perfusion. No significant intracranial hemorrhage. 2. Mass effect with slight shift of the midline to the left approximating 2-3 mm. No evidence of transtentorial or uncal herniation. 3. No new intracranial abnormality elsewhere.   Electronically Signed   By: Hulan Saas M.D.   On: 04/26/2013 21:54   Dg Chest Port 1 View  04/27/2013   CLINICAL DATA:  Atelectasis  EXAM: PORTABLE CHEST - 1 VIEW  COMPARISON:  04/24/2013  FINDINGS: Left IJ high flow catheter extends to the cavoatrial junction as before. Lungs clear. Heart size normal. . No effusion. Mild thoracolumbar levoscoliosis possibly positional.  IMPRESSION: No acute cardiopulmonary disease.   Electronically Signed   By: Oley Balm M.D.   On: 04/27/2013 08:24    Labs: BMET  Recent Labs Lab 04/22/13 2105  04/23/13 1541  04/24/13 5784 04/24/13 0759 04/24/13 0805 04/25/13 0440 04/25/13 1309 04/26/13 0449 04/26/13 1219 04/27/13 0400  NA 139  < > 144  < >  153*  155* 157* 155* 152* 152* 154* 151* 151*  K 4.6  < > 3.5*  --  3.8  --  3.3* 3.0* 2.8* 3.4* 3.3* 4.0  CL 101  < > 102  --  117*  --  116* 110 104 110 105 110  CO2 21  --  29  --  25  --   --  29  --  30  --  26  GLUCOSE 107*  < > 91  --  107*  --  111* 114* 112* 109* 106* 117*   BUN 26*  < > 30*  --  28*  --  26* 27* 27* 25* 23 21  CREATININE 2.49*  < > 2.67*  --  2.38*  --  2.40* 2.31* 2.10* 2.10* 2.00* 1.83*  CALCIUM 9.7  --  8.9  --  8.3*  --   --  8.2*  --  8.2*  --  8.5  PHOS 5.4*  --  4.4  --  4.0  --   --  3.3  --  3.6  --  3.9  < > = values in this interval not displayed. CBC  Recent Labs Lab 04/22/13 2105  04/25/13 1600 04/26/13 0449 04/26/13 1219 04/26/13 1502 04/26/13 2047 04/27/13 0400  WBC 6.4  < > 7.7 7.3  --  5.5  --  5.7  NEUTROABS 3.8  --   --   --   --   --   --   --   HGB 11.6*  < > 8.2* 8.6* 8.5* 6.7* 9.2* 9.0*  HCT 33.4*  < > 24.8* 25.6* 25.0* 20.3* 28.0* 26.9*  MCV 91.0  < > 92.5 92.8  --  94.4  --  93.4  PLT 10*  < > 79* 95*  --  94*  --  110*  < > = values in this interval not displayed.  Medications:    . anticoagulant sodium citrate  5 mL Intracatheter Once  . calcium carbonate  2 tablet Oral Q3H  . calcium gluconate IVPB  4 g Intravenous Once  . calcium gluconate IVPB  4 g Intravenous Once  . calcium gluconate  2 g Intravenous Once  . feeding supplement (ENSURE)  1 Container Oral TID BM  . pantoprazole  40 mg Oral Daily  . simvastatin  20 mg Oral q1800      Assessment/ Plan:   Pt is a 53 y/o FM w/ PMHx of CVA w/ baseline L sided neglect found down at home with confusion and slurred speech w/ thrombocytopenia, hemolytic anemia, AKI, fever, and confusion dx with TTP.  1) TTP with AKI, resolving - Pt with pentad consistent with TTP. ADAMTS 13 low.  LDH trending down, platelets and renal function improving.  NO MORE PLAVIX. Heme wants PLEX through Sunday (6 treatments), then they will return on Monday to make additional recommendations.  (Spoke with Dr. Bertis Ruddy 1/30  who will make recommendations regarding future pheresis plan on Monday.  If the plan is for an every other day program or outpt program for any period of time, would need a tunnelled catheter.  Heme needs assistance with writing orders which we can do as long as  in the inpt environment but if needs outpt will need a different arrangement as she is getting for a non-renal indication and we would not be comfortable directing outpt therapy)  2) H/O of CVA's with new R MCA stroke, L sided neglect.  Neuro following and evaluating.  3) HTN 4) Hyperlipidemia  5) Induced hypernatremia - per neuro who treated with hypertonic saline. Stopped 1/28.  They do not want not "correction" but rather allow to drift back up. 6) Bradycardia episode earlier this admit .  Could certainly be secondary to TTP (intracardia thrombi) which can cause arrhthymias and acute MI's. Troponin elevated x 1 (0.34)    Toula Miyasaki B,MD 04/27/2013 9:52 AM

## 2013-04-27 NOTE — Progress Notes (Signed)
Stroke Team Progress Note  HISTORY Kaitlyn Valdez is an 53 y.o. female with history of stroke that affected her RIGHT side per family member who are at bedside. Daughter states she had been talking to her on the phone between the hours of 9-10 AM and she was speaking normally. Her aunt found patient around 63 AM 04/21/2013 down at home, and per note she was confused, showed slurred speech and lever left sided neglect. Family at bedside state the left sided neglect is new. Patient was transferred to [facility] from [facility] due to PLT count 22 and concern for TTP. TPE was initiated and patient was brought to [facility] hospital. Neurology was consulted concerning possibility of adding Plavix to TPE for embolic prevention.  Patient was not administerd TPA secondary to PLT count of 10. She was admitted to [facility] for further evaluation and treatment.  SUBJECTIVE Her family is not  at the bedside.  Overall she feels her condition is gradually improving. Planned plex 5/6 today.   OBJECTIVE Most recent Vital Signs: Filed Vitals:   04/26/13 2000 04/27/13 0000 04/27/13 0418 04/27/13 0830  BP: 152/87 134/94 152/85 167/94  Pulse: 56 62 55 50  Temp: 99.1 F (37.3 C) 99.4 F (37.4 C) 99 F (37.2 C) 98.8 F (37.1 C)  TempSrc: Oral Oral Axillary Oral  Resp: 18 18  18   Height:      Weight:    64.411 kg (142 lb)  SpO2: 100% 100% 100% 100%   CBG (last 3)   Recent Labs  04/24/13 1547 04/24/13 1914 04/24/13 2359  GLUCAP 180* 171* 106*    IV Fluid Intake:   . citrate dextrose    . citrate dextrose    . citrate dextrose      MEDICATIONS  . anticoagulant sodium citrate  5 mL Intracatheter Once  . calcium carbonate  2 tablet Oral Q3H  . calcium gluconate IVPB  4 g Intravenous Once  . calcium gluconate IVPB  4 g Intravenous Once  . calcium gluconate  2 g Intravenous Once  . feeding supplement (ENSURE)  1 Container Oral TID BM  . pantoprazole  40 mg Oral Daily  . simvastatin  20 mg Oral q1800    PRN:  acetaminophen, acetaminophen, acetaminophen, diphenhydrAMINE, diphenhydrAMINE, diphenhydrAMINE, diphenhydrAMINE, haloperidol lactate, ondansetron (ZOFRAN) IV, RESOURCE THICKENUP CLEAR  Diet:  Dysphagia 1 nectar thick liquids Activity:  Bedrest DVT Prophylaxis:  SCDs   CLINICALLY SIGNIFICANT STUDIES Basic Metabolic Panel:  Recent Labs Lab 04/22/13 2105  04/26/13 0449 04/26/13 1219 04/27/13 0400  NA 139  < > 154* 151* 151*  K 4.6  < > 3.4* 3.3* 4.0  CL 101  < > 110 105 110  CO2 21  < > 30  --  26  GLUCOSE 107*  < > 109* 106* 117*  BUN 26*  < > 25* 23 21  CREATININE 2.49*  < > 2.10* 2.00* 1.83*  CALCIUM 9.7  < > 8.2*  --  8.5  MG 2.0  --   --   --   --   PHOS 5.4*  < > 3.6  --  3.9  < > = values in this interval not displayed. Liver Function Tests:  Recent Labs Lab 04/22/13 2105  04/25/13 0440 04/26/13 0449 04/27/13 0400  AST 54*  --  39*  --   --   ALT 28  --  21  --   --   ALKPHOS 70  --  65  --   --  BILITOT 1.9*  --  0.7  --   --   PROT 7.4  --  5.9*  --   --   ALBUMIN 4.0  < > 3.4* 3.2* 3.5  < > = values in this interval not displayed. CBC:  Recent Labs Lab 04/22/13 2105  04/26/13 1502 04/26/13 2047 04/27/13 0400  WBC 6.4  < > 5.5  --  5.7  NEUTROABS 3.8  --   --   --   --   HGB 11.6*  < > 6.7* 9.2* 9.0*  HCT 33.4*  < > 20.3* 28.0* 26.9*  MCV 91.0  < > 94.4  --  93.4  PLT 10*  < > 94*  --  110*  < > = values in this interval not displayed. Coagulation:   Recent Labs Lab 04/22/13 2105  LABPROT 14.1  INR 1.11   Cardiac Enzymes:   Recent Labs Lab 04/25/13 1010 04/25/13 1610 04/25/13 2135  TROPONINI 0.34* <0.30 <0.30   Urinalysis:   Recent Labs Lab 04/26/13 1057 04/26/13 1443  COLORURINE YELLOW YELLOW  LABSPEC 1.017 1.015  PHURINE 8.5* 8.5*  GLUCOSEU NEGATIVE NEGATIVE  HGBUR SMALL* SMALL*  BILIRUBINUR NEGATIVE NEGATIVE  KETONESUR NEGATIVE NEGATIVE  PROTEINUR 30* NEGATIVE  UROBILINOGEN 1.0 1.0  NITRITE NEGATIVE NEGATIVE   LEUKOCYTESUR NEGATIVE NEGATIVE   Lipid Panel    Component Value Date/Time   CHOL 122 04/24/2013 0313   TRIG 99 04/24/2013 0313   HDL 37* 04/24/2013 0313   CHOLHDL 3.3 04/24/2013 0313   VLDL 20 04/24/2013 0313   LDLCALC 65 04/24/2013 0313   HgbA1C  Lab Results  Component Value Date   HGBA1C 4.8 04/23/2013    Urine Drug Screen:   No results found for this basename: labopia,  cocainscrnur,  labbenz,  amphetmu,  thcu,  labbarb    Alcohol Level: No results found for this basename: ETH,  in the last 168 hours  Ct Head Wo Contrast  04/26/2013   CLINICAL DATA:  Follow-up right middle cerebral artery distribution stroke.  EXAM: CT HEAD WITHOUT CONTRAST  TECHNIQUE: Contiguous axial images were obtained from the base of the skull through the vertex without intravenous contrast.  COMPARISON:  CT HEAD W/O CM dated 04/23/2013  FINDINGS: Further evolution of the large right middle cerebral artery distribution stroke, with involvement of the right temporal, frontal and parietal lobes. Minimal petechial hemorrhage at the anterior extent of the stroke in the posterior right frontal lobe consistent with rib bloods reperfusion. Mass effect with effacement of cortical sulci in the right cerebral hemisphere, slight compression of the right lateral ventricle, and slight shift of the midline to the left approximating 2-3 mm. No evidence of uncal or transtentorial herniation.  Encephalomalacia involving the left frontal lobe, left parietal lobe, and left occipital lobe, related to old cortical strokes. No evidence of hydrocephalus. No new intracranial abnormality elsewhere.  No focal osseous abnormality involving the skull. Mucous retention cyst or polyp in the left maxillary sinus and minimal mucosal thickening involving the left sphenoid sinus, unchanged. Remaining paranasal sinuses, bilateral mastoid air cells and bilateral middle ear cavities well aerated. Bilateral carotid siphon atherosclerosis.  IMPRESSION: 1.  Further evolution of the large right middle cerebral artery distribution stroke, with minimal petechial hemorrhage at the anterior extent of the infarct in the posterior left frontal lobe as a result of luxury perfusion. No significant intracranial hemorrhage. 2. Mass effect with slight shift of the midline to the left approximating 2-3 mm. No evidence of transtentorial or  uncal herniation. 3. No new intracranial abnormality elsewhere.   Electronically Signed   By: Hulan Saas M.D.   On: 04/26/2013 21:54   Dg Chest Port 1 View  04/27/2013   CLINICAL DATA:  Atelectasis  EXAM: PORTABLE CHEST - 1 VIEW  COMPARISON:  04/24/2013  FINDINGS: Left IJ high flow catheter extends to the cavoatrial junction as before. Lungs clear. Heart size normal. . No effusion. Mild thoracolumbar levoscoliosis possibly positional.  IMPRESSION: No acute cardiopulmonary disease.   Electronically Signed   By: Oley Balm M.D.   On: 04/27/2013 08:24    CT of the brain  04/24/2013  04/23/2013 Exam is motion degraded. Large nonhemorrhagic acute right middle cerebral artery distribution infarct with slight compression of the right lateral ventricle without midline shift. Patient is at risk for development of further mass effect/hemorrhage as the infarct evolves. Remote infarct with encephalomalacia left posterior temporal -parietal lobe.   04/26/13: Evolving R MCA infarct.   MRI of the brain    MRA of the brain    2D Echocardiogram  EF 60-65% with no source of embolus.   Carotid Doppler  Bilateral: 1-39% ICA stenosis. Vertebral artery flow is antegrade.   TCD   CXR   04/24/2013 1. Slightly increased pulmonary vascular congestion without evidence of pulmonary edema. 2. Stable position of left IJ hemodialysis catheter.  04/22/2013 Left IJ dialysis catheter tip mid SVC level No acute finding   EKG  Sinus bradycardia with short PR. Nonspecific ST abnormality  Therapy Recommendations  pending  Physical Exam   Elderly  caucasian lady not in distress. Afebrile. Head is nontraumatic. Neck is supple without bruit. Cardiac exam no murmur or gallop. Lungs are clear to auscultation. Distal pulses are well felt.  Neurological Exam : Awake alert. Right gaze preference but can look up to the left till midline. Does not blink to threat on the left but does so on the right. Pupils 4 mm irregular but sluggishly reactive. Fundi were not visualized. Speech is dysarthric and she is disoriented and confused and distractible. Follows simple one-step commands only. Tongue is midline. Left lower facial weakness. Left hemiparesis but will withdraw left upper and lower extremity to painful stimuli. Purposeful movements and right side against gravity. Left plantar is upgoing right is downgoing. Diminished sensation on the left and significant left hemineglect. Gait was not tested.   ASSESSMENT Kaitlyn Valdez is a 53 y.o. female presenting with confusion, slurred speech, and left sided neglect. Imaging confirms a right wedge shaped MCA infarct. Infarct felt to be embolic given 3 prior infarcts in the left brain. Such large vessel infarcts unlikely to be associated with TTP which typically causes microangiopathy, especially recurrent strokes. Other differentials include: cardioembolic source, primary hypercoagulable source, etc. On clopidogrel 75 mg orally every day prior to admission. Now on no antithrombotics for secondary stroke prevention. Patient with resultant confusion, dysarthria, neurologic neglect, dysphagia. Work up underway.   Recurrent TTP , undergoing plasmaphoresis   Severe anemia   Acute Renal failure, Cr 2.4     Induced hypernatremia in order to decrease cerebral edema, Na 154 this am, protocol stopped 04/24/2013, Na drifting down slowly   Hypertension   Hyperlipidemia, LDL 65, on pravachol 40 mg daily PTA, now on no statin as NPO, goal LDL < 100 (< 70 for diabetics)   Hx strokes - baseline L HP per family  suggests old right brain stroke; old left brain strokes seen on imaging   Depression  Hospital day #  5  TREATMENT/PLAN  No antithrombotics at this time for secondary stroke prevention due to TTP   Await transcranial Doppler results .   CTH repeat evolution of R MCA infarct.   TCD when possible  Plex #5 today.   Case d/w with family at bedside.   Stable Hb at 9.0 today.   Pauletta Browns   I have personally obtained a history, examined the patient, evaluated imaging results, and formulated the assessment and plan of care. I agree with the above.

## 2013-04-27 NOTE — Progress Notes (Signed)
[facility] PROGRESS NOTE  Kaitlyn Valdez T Scholle ZOX:[id number] DOB: 1961-01-09 DOA: 04/22/2013 PCP: Isabella StallingNDIEGO,[name], MD  Assessment/Plan: Large Right MCA CVA -Presumed related to her TTP. -No antithrombotics for secondary stroke prevention given TTP. -Neuro following.  TTP -Currently receiving daily plasmapheresis. -Plt up to 110 today. -Plan to continue TPE until plt>140. -Hematology following.  Hypernatremia -Was induced by neuro for cerebral edema. -Drifiting down slowly.  Code Status: Full Family Communication: Patient only  Disposition Plan: To be dtermined   Consultants:  Renal  Heme/onc   Antibiotics:  None   Subjective: Left neglect  Objective: Filed Vitals:   04/27/13 1533 04/27/13 1540 04/27/13 1547 04/27/13 1558  BP: 141/96 142/102 139/111 141/77  Pulse: 94 97 101 96  Temp: 99.5 F (37.5 C) 99.7 F (37.6 C) 100.7 F (38.2 C) 97.9 F (36.6 C)  TempSrc: Oral Oral Oral Oral  Resp: 30 27 22 28   Height:      Weight:      SpO2:        Intake/Output Summary (Last 24 hours) at 04/27/13 1654 Last data filed at 04/27/13 0839  Gross per 24 hour  Intake    150 ml  Output    750 ml  Net   -600 ml   Filed Weights   04/26/13 0500 04/27/13 0830 04/27/13 1400  Weight: 60.7 kg (133 lb 13.1 oz) 64.411 kg (142 lb) 64.4 kg (141 lb 15.6 oz)    Exam:   General:  awake  Cardiovascular: RRR  Respiratory: CTA B  Abdomen: S/ND/+BS  Extremities: trace bilateral edema   Data Reviewed: Basic Metabolic Panel:  Recent Labs Lab 04/22/13 2105  04/23/13 1541  04/24/13 0313  04/25/13 0440 04/25/13 1309 04/26/13 0449 04/26/13 1219 04/27/13 0400 04/27/13 1342  NA 139  < > 144  < > 153*  155*  < > 152* 152* 154* 151* 151* 153*  K 4.6  < > 3.5*  --  3.8  < > 3.0* 2.8* 3.4* 3.3* 4.0 3.4*  CL 101  < > 102  --  117*  < > 110 104 110 105 110 103  CO2 21  --  29  --  25  --  29  --  30  --  26  --   GLUCOSE 107*  < > 91  --  107*  < > 114* 112*  109* 106* 117* 91  BUN 26*  < > 30*  --  28*  < > 27* 27* 25* 23 21 22   CREATININE 2.49*  < > 2.67*  --  2.38*  < > 2.31* 2.10* 2.10* 2.00* 1.83* 1.90*  CALCIUM 9.7  --  8.9  --  8.3*  --  8.2*  --  8.2*  --  8.5  --   MG 2.0  --   --   --   --   --   --   --   --   --   --   --   PHOS 5.4*  --  4.4  --  4.0  --  3.3  --  3.6  --  3.9  --   < > = values in this interval not displayed. Liver Function Tests:  Recent Labs Lab 04/22/13 2105 04/23/13 1541 04/24/13 0313 04/25/13 0440 04/26/13 0449 04/27/13 0400  AST 54*  --   --  39*  --   --   ALT 28  --   --  21  --   --  ALKPHOS 70  --   --  65  --   --   BILITOT 1.9*  --   --  0.7  --   --   PROT 7.4  --   --  5.9*  --   --   ALBUMIN 4.0 3.3* 3.1* 3.4* 3.2* 3.5   No results found for this basename: LIPASE, AMYLASE,  in the last 168 hours No results found for this basename: AMMONIA,  in the last 168 hours CBC:  Recent Labs Lab 04/22/13 2105  04/25/13 0543  04/25/13 1600 04/26/13 0449 04/26/13 1219 04/26/13 1502 04/26/13 2047 04/27/13 0400 04/27/13 1342  WBC 6.4  < > 6.2  --  7.7 7.3  --  5.5  --  5.7  --   NEUTROABS 3.8  --   --   --   --   --   --   --   --   --   --   HGB 11.6*  < > 8.1*  < > 8.2* 8.6* 8.5* 6.7* 9.2* 9.0* 8.8*  HCT 33.4*  < > 24.1*  < > 24.8* 25.6* 25.0* 20.3* 28.0* 26.9* 26.0*  MCV 91.0  < > 92.3  --  92.5 92.8  --  94.4  --  93.4  --   PLT 10*  < > 61*  --  79* 95*  --  94*  --  110*  --   < > = values in this interval not displayed. Cardiac Enzymes:  Recent Labs Lab 04/25/13 1010 04/25/13 1610 04/25/13 2135  TROPONINI 0.34* <0.30 <0.30   BNP (last 3 results) No results found for this basename: PROBNP,  in the last 8760 hours CBG:  Recent Labs Lab 04/24/13 0806 04/24/13 1148 04/24/13 1547 04/24/13 1914 04/24/13 2359  GLUCAP 124* 121* 180* 171* 106*    Recent Results (from the past 240 hour(s))  MRSA PCR SCREENING     Status: None   Collection Time    04/22/13  7:04 PM       Result Value Range Status   MRSA by PCR NEGATIVE  NEGATIVE Final   Comment:            The GeneXpert MRSA Assay (FDA     approved for NASAL specimens     only), is one component of a     comprehensive MRSA colonization     surveillance program. It is not     intended to diagnose MRSA     infection nor to guide or     monitor treatment for     MRSA infections.  CULTURE, BLOOD (ROUTINE X 2)     Status: None   Collection Time    04/22/13 10:10 PM      Result Value Range Status   Specimen Description BLOOD HEMODIALYSIS CATHETER   Final   Special Requests BOTTLES DRAWN AEROBIC AND ANAEROBIC 10CC EACH   Final   Culture  Setup Time     Final   Value: 04/23/2013 03:39     Performed at [facility]   Culture     Final   Value:        BLOOD CULTURE RECEIVED NO GROWTH TO DATE CULTURE WILL BE HELD FOR 5 DAYS BEFORE ISSUING A FINAL NEGATIVE REPORT     Performed at [facility]   Report Status PENDING   Incomplete  CULTURE, BLOOD (ROUTINE X 2)     Status: None   Collection Time  04/22/13 10:18 PM      Result Value Range Status   Specimen Description BLOOD LEFT FOREARM   Final   Special Requests BOTTLES DRAWN AEROBIC ONLY 5CC   Final   Culture  Setup Time     Final   Value: 04/23/2013 03:03     Performed at [facility]   Culture     Final   Value:        BLOOD CULTURE RECEIVED NO GROWTH TO DATE CULTURE WILL BE HELD FOR 5 DAYS BEFORE ISSUING A FINAL NEGATIVE REPORT     Performed at [facility]   Report Status PENDING   Incomplete  URINE CULTURE     Status: None   Collection Time    04/23/13 11:57 AM      Result Value Range Status   Specimen Description URINE, CATHETERIZED   Final   Special Requests NONE   Final   Culture  Setup Time     Final   Value: 04/23/2013 12:45     Performed at [facility] Count     Final   Value: NO GROWTH     Performed at [facility]   Culture     Final   Value: NO GROWTH     Performed at  [facility]   Report Status 04/24/2013 FINAL   Final     Studies: Ct Head Wo Contrast  04/26/2013   CLINICAL DATA:  Follow-up right middle cerebral artery distribution stroke.  EXAM: CT HEAD WITHOUT CONTRAST  TECHNIQUE: Contiguous axial images were obtained from the base of the skull through the vertex without intravenous contrast.  COMPARISON:  CT HEAD W/O CM dated 04/23/2013  FINDINGS: Further evolution of the large right middle cerebral artery distribution stroke, with involvement of the right temporal, frontal and parietal lobes. Minimal petechial hemorrhage at the anterior extent of the stroke in the posterior right frontal lobe consistent with rib bloods reperfusion. Mass effect with effacement of cortical sulci in the right cerebral hemisphere, slight compression of the right lateral ventricle, and slight shift of the midline to the left approximating 2-3 mm. No evidence of uncal or transtentorial herniation.  Encephalomalacia involving the left frontal lobe, left parietal lobe, and left occipital lobe, related to old cortical strokes. No evidence of hydrocephalus. No new intracranial abnormality elsewhere.  No focal osseous abnormality involving the skull. Mucous retention cyst or polyp in the left maxillary sinus and minimal mucosal thickening involving the left sphenoid sinus, unchanged. Remaining paranasal sinuses, bilateral mastoid air cells and bilateral middle ear cavities well aerated. Bilateral carotid siphon atherosclerosis.  IMPRESSION: 1. Further evolution of the large right middle cerebral artery distribution stroke, with minimal petechial hemorrhage at the anterior extent of the infarct in the posterior left frontal lobe as a result of luxury perfusion. No significant intracranial hemorrhage. 2. Mass effect with slight shift of the midline to the left approximating 2-3 mm. No evidence of transtentorial or uncal herniation. 3. No new intracranial abnormality elsewhere.    Electronically Signed   By: Hulan Saas M.D.   On: 04/26/2013 21:54   Dg Chest Port 1 View  04/27/2013   CLINICAL DATA:  Atelectasis  EXAM: PORTABLE CHEST - 1 VIEW  COMPARISON:  04/24/2013  FINDINGS: Left IJ high flow catheter extends to the cavoatrial junction as before. Lungs clear. Heart size normal. . No effusion. Mild thoracolumbar levoscoliosis possibly positional.  IMPRESSION: No acute cardiopulmonary disease.   Electronically  Signed   By: Oley Balm M.D.   On: 04/27/2013 08:24    Scheduled Meds: . calcium gluconate IVPB  4 g Intravenous Once  . calcium gluconate IVPB  4 g Intravenous Once  . calcium gluconate  2 g Intravenous Once  . feeding supplement (ENSURE)  1 Container Oral TID BM  . pantoprazole  40 mg Oral Daily  . simvastatin  20 mg Oral q1800   Continuous Infusions: . citrate dextrose    . citrate dextrose      Principal Problem:   TTP (thrombotic thrombocytopenic purpura) Active Problems:   Acute encephalopathy   History of CVA (cerebrovascular accident)   Depression   Essential hypertension, benign   Arterial ischemic stroke, MCA (middle cerebral artery), right, acute    Time spent: 35 minutes. Greater than 50% of this time was spent in direct contact with the patient coordinating care.    Chaya Jan  [facility] Pager [phone]  If 7PM-7AM, please contact night-coverage at [email or web address], password [facility] 04/27/2013, 4:54 PM  LOS: 5 days

## 2013-04-27 NOTE — Progress Notes (Signed)
PT Cancellation Note  Patient Details Name: Kaitlyn Valdez MRN: [id number] DOB: 1960/10/01   Cancelled Treatment:     Pt off the floor at hemodialysis at this time. Will re-attempt at another time.    Donnamarie [place], Ofelia Podolski [place], [place] [phone] 04/27/2013, 2:07 PM

## 2013-04-28 LAB — THERAPEUTIC PLASMA EXCHANGE (BLOOD BANK)
PLASMA VOLUME NEEDED: 3950
UNIT DIVISION: 0
UNIT DIVISION: 0
UNIT DIVISION: 0
UNIT DIVISION: 0
UNIT DIVISION: 0
UNIT DIVISION: 0
UNIT DIVISION: 0
Unit division: 0
Unit division: 0
Unit division: 0
Unit division: 0
Unit division: 0
Unit division: 0

## 2013-04-28 LAB — CBC
HCT: 25.8 % — ABNORMAL LOW (ref 36.0–46.0)
HCT: 24.1 % — ABNORMAL LOW (ref 36.0–46.0)
Hemoglobin: 8.4 g/dL — ABNORMAL LOW (ref 12.0–15.0)
Hemoglobin: 7.8 g/dL — ABNORMAL LOW (ref 12.0–15.0)
MCH: 31.2 pg (ref 26.0–34.0)
MCH: 31 pg (ref 26.0–34.0)
MCHC: 32.6 g/dL (ref 30.0–36.0)
MCHC: 32.4 g/dL (ref 30.0–36.0)
MCV: 95.9 fL (ref 78.0–100.0)
MCV: 95.6 fL (ref 78.0–100.0)
PLATELETS: 168 10*3/uL (ref 150–400)
Platelets: 185 10*3/uL (ref 150–400)
RBC: 2.69 MIL/uL — AB (ref 3.87–5.11)
RBC: 2.52 MIL/uL — AB (ref 3.87–5.11)
RDW: 15.5 % (ref 11.5–15.5)
RDW: 15.6 % — AB (ref 11.5–15.5)
WBC: 6.1 10*3/uL (ref 4.0–10.5)
WBC: 6 10*3/uL (ref 4.0–10.5)

## 2013-04-28 LAB — RENAL FUNCTION PANEL
ALBUMIN: 3.5 g/dL (ref 3.5–5.2)
BUN: 17 mg/dL (ref 6–23)
CALCIUM: 8.7 mg/dL (ref 8.4–10.5)
CHLORIDE: 108 meq/L (ref 96–112)
CO2: 30 mEq/L (ref 19–32)
Creatinine, Ser: 1.87 mg/dL — ABNORMAL HIGH (ref 0.50–1.10)
GFR calc Af Amer: 34 mL/min — ABNORMAL LOW (ref >90)
GFR, EST NON AFRICAN AMERICAN: 29 mL/min — AB (ref >90)
Glucose, Bld: 89 mg/dL (ref 70–99)
Phosphorus: 3.9 mg/dL (ref 2.3–4.6)
Potassium: 3.2 mEq/L — ABNORMAL LOW (ref 3.7–5.3)
Sodium: 152 mEq/L — ABNORMAL HIGH (ref 137–147)

## 2013-04-28 LAB — POCT I-STAT, CHEM 8
BUN: 16 mg/dL (ref 6–23)
CALCIUM ION: 0.99 mmol/L — AB (ref 1.12–1.23)
CHLORIDE: 102 meq/L (ref 96–112)
Creatinine, Ser: 1.9 mg/dL — ABNORMAL HIGH (ref 0.50–1.10)
Glucose, Bld: 107 mg/dL — ABNORMAL HIGH (ref 70–99)
HCT: 44 % (ref 36.0–46.0)
Hemoglobin: 15 g/dL (ref 12.0–15.0)
POTASSIUM: 3 meq/L — AB (ref 3.7–5.3)
Sodium: 150 mEq/L — ABNORMAL HIGH (ref 137–147)
TCO2: 26 mmol/L (ref 0–100)

## 2013-04-28 LAB — LACTATE DEHYDROGENASE: LDH: 278 U/L — AB (ref 94–250)

## 2013-04-28 LAB — GLUCOSE, CAPILLARY: Glucose-Capillary: 94 mg/dL (ref 70–99)

## 2013-04-28 MED ORDER — CALCIUM CARBONATE ANTACID 500 MG PO CHEW
2.0000 | CHEWABLE_TABLET | ORAL | Status: AC
  Administered 2013-04-28 (×2): 400 mg via ORAL
  Filled 2013-04-28 (×2): qty 2

## 2013-04-28 MED ORDER — ACD FORMULA A 0.73-2.45-2.2 GM/100ML VI SOLN
500.0000 mL | Status: DC
  Administered 2013-04-28: 500 mL via INTRAVENOUS
  Filled 2013-04-28: qty 500

## 2013-04-28 MED ORDER — DIPHENHYDRAMINE HCL 25 MG PO CAPS: 25.0000 mg | ORAL_CAPSULE | Freq: Four times a day (QID) | ORAL | Status: DC | PRN

## 2013-04-28 MED ORDER — ANTICOAGULANT SODIUM CITRATE 4% (200MG/5ML) IV SOLN
5.0000 mL | Freq: Once | Status: AC
  Filled 2013-04-28 (×2): qty 250

## 2013-04-28 MED ORDER — SODIUM CHLORIDE 0.9 % IV SOLN
4.0000 g | Freq: Once | INTRAVENOUS | Status: AC
  Administered 2013-04-28: 4 g via INTRAVENOUS
  Filled 2013-04-28 (×2): qty 40

## 2013-04-28 MED ORDER — ACD FORMULA A 0.73-2.45-2.2 GM/100ML VI SOLN
Status: AC
  Filled 2013-04-28: qty 500

## 2013-04-28 MED ORDER — ACETAMINOPHEN 325 MG PO TABS: 650.0000 mg | ORAL_TABLET | ORAL | Status: DC | PRN

## 2013-04-28 MED ORDER — CALCIUM CARBONATE ANTACID 500 MG PO CHEW
CHEWABLE_TABLET | ORAL | Status: AC
  Filled 2013-04-28: qty 2

## 2013-04-28 NOTE — Procedures (Signed)
I have personally attended this patient's plasma exchange session.   1.5 plasma volumes No issues other than pt's mild agitation and confusion (unchanged from when I saw her earlier today)   Kaitlyn Valdez B

## 2013-04-28 NOTE — Progress Notes (Signed)
Stroke Team Progress Note  HISTORY Kaitlyn Valdez is an 53 y.o. female with history of stroke that affected her RIGHT side per family member who are at bedside. Daughter states she had been talking to her on the phone between the hours of 9-10 AM and she was speaking normally. Her aunt found patient around 6511 AM 04/21/2013 down at home, and per note she was confused, showed slurred speech and lever left sided neglect. Family at bedside state the left sided neglect is new. Patient [facility] due to PLT count 22 and concern for TTP. TPE was initiated and patient was brought [facility]. Neurology was consulted concerning possibility of adding Plavix to TPE for embolic prevention.  Patient was not administerd TPA secondary to PLT count of 10. [facility].  SUBJECTIVE No change in neurological exam. S/p Plex. Periods of confusion.   OBJECTIVE Most recent Vital Signs: Filed Vitals:   04/28/13 0000 04/28/13 0400 04/28/13 0805 04/28/13 0900  BP: 150/88 158/97 120/98   Pulse: 55 65 61   Temp: 99.7 F (37.6 C) 99 F (37.2 C) 98.1 F (36.7 C)   TempSrc:   Oral   Resp: 18 16 17    Height:    5\' 5"  (1.651 m)  Weight:    66.906 kg (147 lb 8 oz)  SpO2: 95% 94% 96%    CBG (last 3)  No results found for this basename: GLUCAP,  in the last 72 hours  IV Fluid Intake:   . citrate dextrose    . citrate dextrose    . citrate dextrose      MEDICATIONS  . anticoagulant sodium citrate  5 mL Intracatheter Once  . calcium carbonate  2 tablet Oral Q3H  . calcium gluconate IVPB  4 g Intravenous Once  . calcium gluconate IVPB  4 g Intravenous Once  . calcium gluconate  2 g Intravenous Once  . citrate dextrose      . feeding supplement (ENSURE)  1 Container Oral TID BM  . pantoprazole  40 mg Oral Daily  . simvastatin  20 mg Oral q1800   PRN:  acetaminophen, acetaminophen, acetaminophen, diphenhydrAMINE,  diphenhydrAMINE, diphenhydrAMINE, haloperidol lactate, ondansetron (ZOFRAN) IV, RESOURCE THICKENUP CLEAR  Diet:  Dysphagia 1 nectar thick liquids Activity:  Bedrest DVT Prophylaxis:  SCDs   CLINICALLY SIGNIFICANT STUDIES Basic Metabolic Panel:  Recent Labs Lab 04/22/13 2105  04/27/13 0400 04/27/13 1342 04/28/13 0500  NA 139  < > 151* 153* 152*  K 4.6  < > 4.0 3.4* 3.2*  CL 101  < > 110 103 108  CO2 21  < > 26  --  30  GLUCOSE 107*  < > 117* 91 89  BUN 26*  < > 21 22 17   CREATININE 2.49*  < > 1.83* 1.90* 1.87*  CALCIUM 9.7  < > 8.5  --  8.7  MG 2.0  --   --   --   --   PHOS 5.4*  < > 3.9  --  3.9  < > = values in this interval not displayed. Liver Function Tests:  Recent Labs Lab 04/22/13 2105  04/25/13 0440  04/27/13 0400 04/28/13 0500  AST 54*  --  39*  --   --   --   ALT 28  --  21  --   --   --   ALKPHOS 70  --  65  --   --   --  BILITOT 1.9*  --  0.7  --   --   --   PROT 7.4  --  5.9*  --   --   --   ALBUMIN 4.0  < > 3.4*  < > 3.5 3.5  < > = values in this interval not displayed. CBC:  Recent Labs Lab 04/22/13 2105  04/27/13 1815 04/28/13 0400  WBC 6.4  < > 7.0 6.1  NEUTROABS 3.8  --   --   --   HGB 11.6*  < > 7.8* 8.4*  HCT 33.4*  < > 23.9* 25.8*  MCV 91.0  < > 94.8 95.9  PLT 10*  < > 131* 168  < > = values in this interval not displayed. Coagulation:   Recent Labs Lab 04/22/13 2105  LABPROT 14.1  INR 1.11   Cardiac Enzymes:   Recent Labs Lab 04/25/13 1010 04/25/13 1610 04/25/13 2135  TROPONINI 0.34* <0.30 <0.30   Urinalysis:   Recent Labs Lab 04/26/13 1057 04/26/13 1443  COLORURINE YELLOW YELLOW  LABSPEC 1.017 1.015  PHURINE 8.5* 8.5*  GLUCOSEU NEGATIVE NEGATIVE  HGBUR SMALL* SMALL*  BILIRUBINUR NEGATIVE NEGATIVE  KETONESUR NEGATIVE NEGATIVE  PROTEINUR 30* NEGATIVE  UROBILINOGEN 1.0 1.0  NITRITE NEGATIVE NEGATIVE  LEUKOCYTESUR NEGATIVE NEGATIVE   Lipid Panel    Component Value Date/Time   CHOL 122 04/24/2013 0313   TRIG  99 04/24/2013 0313   HDL 37* 04/24/2013 0313   CHOLHDL 3.3 04/24/2013 0313   VLDL 20 04/24/2013 0313   LDLCALC 65 04/24/2013 0313   HgbA1C  Lab Results  Component Value Date   HGBA1C 4.8 04/23/2013    Urine Drug Screen:   No results found for this basename: labopia,  cocainscrnur,  labbenz,  amphetmu,  thcu,  labbarb    Alcohol Level: No results found for this basename: ETH,  in the last 168 hours  Ct Head Wo Contrast  04/26/2013   CLINICAL DATA:  Follow-up right middle cerebral artery distribution stroke.  EXAM: CT HEAD WITHOUT CONTRAST  TECHNIQUE: Contiguous axial images were obtained from the base of the skull through the vertex without intravenous contrast.  COMPARISON:  CT HEAD W/O CM dated 04/23/2013  FINDINGS: Further evolution of the large right middle cerebral artery distribution stroke, with involvement of the right temporal, frontal and parietal lobes. Minimal petechial hemorrhage at the anterior extent of the stroke in the posterior right frontal lobe consistent with rib bloods reperfusion. Mass effect with effacement of cortical sulci in the right cerebral hemisphere, slight compression of the right lateral ventricle, and slight shift of the midline to the left approximating 2-3 mm. No evidence of uncal or transtentorial herniation.  Encephalomalacia involving the left frontal lobe, left parietal lobe, and left occipital lobe, related to old cortical strokes. No evidence of hydrocephalus. No new intracranial abnormality elsewhere.  No focal osseous abnormality involving the skull. Mucous retention cyst or polyp in the left maxillary sinus and minimal mucosal thickening involving the left sphenoid sinus, unchanged. Remaining paranasal sinuses, bilateral mastoid air cells and bilateral middle ear cavities well aerated. Bilateral carotid siphon atherosclerosis.  IMPRESSION: 1. Further evolution of the large right middle cerebral artery distribution stroke, with minimal petechial hemorrhage at  the anterior extent of the infarct in the posterior left frontal lobe as a result of luxury perfusion. No significant intracranial hemorrhage. 2. Mass effect with slight shift of the midline to the left approximating 2-3 mm. No evidence of transtentorial or uncal herniation. 3. No new intracranial abnormality  elsewhere.   Electronically Signed   By: Hulan Saas M.D.   On: 04/26/2013 21:54   Dg Chest Port 1 View  04/27/2013   CLINICAL DATA:  Atelectasis  EXAM: PORTABLE CHEST - 1 VIEW  COMPARISON:  04/24/2013  FINDINGS: Left IJ high flow catheter extends to the cavoatrial junction as before. Lungs clear. Heart size normal. . No effusion. Mild thoracolumbar levoscoliosis possibly positional.  IMPRESSION: No acute cardiopulmonary disease.   Electronically Signed   By: Oley Balm M.D.   On: 04/27/2013 08:24    CT of the brain  04/24/2013  04/23/2013 Exam is motion degraded. Large nonhemorrhagic acute right middle cerebral artery distribution infarct with slight compression of the right lateral ventricle without midline shift. Patient is at risk for development of further mass effect/hemorrhage as the infarct evolves. Remote infarct with encephalomalacia left posterior temporal -parietal lobe.   04/26/13: Evolving R MCA infarct.   MRI of the brain    MRA of the brain    2D Echocardiogram  EF 60-65% with no source of embolus.   Carotid Doppler  Bilateral: 1-39% ICA stenosis. Vertebral artery flow is antegrade.   TCD   CXR   04/24/2013 1. Slightly increased pulmonary vascular congestion without evidence of pulmonary edema. 2. Stable position of left IJ hemodialysis catheter.  04/22/2013 Left IJ dialysis catheter tip mid SVC level No acute finding   EKG  Sinus bradycardia with short PR. Nonspecific ST abnormality  Therapy Recommendations  pending  Physical Exam   Elderly caucasian lady not in distress. Afebrile. Head is nontraumatic. Neck is supple without bruit. Cardiac exam no murmur or  gallop. Lungs are clear to auscultation. Distal pulses are well felt.  Neurological Exam : Awake alert. Right gaze preference but can look up to the left till midline. Does not blink to threat on the left but does so on the right. Pupils 4 mm irregular but sluggishly reactive. Fundi were not visualized. Speech is dysarthric and she is disoriented and confused and distractible. Follows simple one-step commands only. Tongue is midline. Left lower facial weakness. Left hemiparesis but will withdraw left upper and lower extremity to painful stimuli. Purposeful movements and right side against gravity. Left plantar is upgoing right is downgoing. Diminished sensation on the left and significant left hemineglect. Gait was not tested.   ASSESSMENT Kaitlyn Valdez is a 53 y.o. female presenting with confusion, slurred speech, and left sided neglect. Imaging confirms a right wedge shaped MCA infarct. Infarct felt to be embolic given 3 prior infarcts in the left brain. Such large vessel infarcts unlikely to be associated with TTP which typically causes microangiopathy, especially recurrent strokes. Other differentials include: cardioembolic source, primary hypercoagulable source, etc. On clopidogrel 75 mg orally every day prior to admission. Now on no antithrombotics for secondary stroke prevention. Patient with resultant confusion, dysarthria, neurologic neglect, dysphagia. Work up underway.   Recurrent TTP , undergoing plasmaphoresis   Severe anemia   Acute Renal failure, Cr 2.4     Induced hypernatremia in order to decrease cerebral edema, Na 154 this am, protocol stopped 04/24/2013, Na drifting down slowly   Hypertension   Hyperlipidemia, LDL 65, on pravachol 40 mg daily PTA, now on no statin as NPO, goal LDL < 100 (< 70 for diabetics)   Hx strokes - baseline L HP per family suggests old right brain stroke; old left brain strokes seen on imaging   Depression  Hospital day #  6  TREATMENT/PLAN  No antithrombotics  at this time for secondary stroke prevention due to TTP   Await transcranial Doppler results .   CTH repeat evolution of R MCA infarct.   S/p Plex #5 yesterday.   Case d/w with family at bedside.   Stable Hb at 8.4   AMS   Platelets improved to 168  Rhen Kawecki   I have personally obtained a history, examined the patient, evaluated imaging results, and formulated the assessment and plan of care. I agree with the above.

## 2013-04-28 NOTE — Progress Notes (Signed)
[facility] Progress Note   Subjective:   Transfused yesterday for Hb ,7 For 6th plasma exchange today.  Remains disoriented - (now that she is less delerious, much easier to assess her orientation) "I'm gonna put my clothes on and go to church right over there" When reminded she is in a hospital - thought Kaitlyn Valdez Had a sitter yesterday - no one with her today     Objective:   BP 120/98  Pulse 61  Temp(Src) 98.1 F (36.7 C) (Oral)  Resp 17  Ht 5\' 5"  (1.651 m)  Wt 64.4 kg (141 lb 15.6 oz)  BMI 23.63 kg/m2  SpO2 96%  Intake/Output Summary (Last 24 hours) at 04/28/13 0854 Last data filed at 04/28/13 0500  Gross per 24 hour  Intake      0 ml  Output    950 ml  Net   -950 ml   Weight change:   Physical Exam: Gen:NAD, in bed Left IJ temp cath (1/27) Disoriented to place, time, situation but still asks about her platelets Cardio: RRR, no murmurs appreciated  Resp:CTAB, no wheezing ZOX:WRUE/AV/WU, no CVA tendernes Ext: no edema B/L  Neuro:  Awake, hands in mittens, wants to gett OOB and put clothes on to go to church; "Kaitlyn Valdez"  Left sided neglect very pronounced.  Dysarthric speech.    Imaging: Ct Head Wo Contrast  04/26/2013   CLINICAL DATA:  Follow-up right middle cerebral artery distribution stroke.  EXAM: CT HEAD WITHOUT CONTRAST  TECHNIQUE: Contiguous axial images were obtained from the base of the skull through the vertex without intravenous contrast.  COMPARISON:  CT HEAD W/O CM dated 04/23/2013  FINDINGS: Further evolution of the large right middle cerebral artery distribution stroke, with involvement of the right temporal, frontal and parietal lobes. Minimal petechial hemorrhage at the anterior extent of the stroke in the posterior right frontal lobe consistent with rib bloods reperfusion. Mass effect with effacement of cortical sulci in the right cerebral hemisphere, slight compression of the right lateral ventricle, and slight shift of the midline  to the left approximating 2-3 mm. No evidence of uncal or transtentorial herniation.  Encephalomalacia involving the left frontal lobe, left parietal lobe, and left occipital lobe, related to old cortical strokes. No evidence of hydrocephalus. No new intracranial abnormality elsewhere.  No focal osseous abnormality involving the skull. Mucous retention cyst or polyp in the left maxillary sinus and minimal mucosal thickening involving the left sphenoid sinus, unchanged. Remaining paranasal sinuses, bilateral mastoid air cells and bilateral middle ear cavities well aerated. Bilateral carotid siphon atherosclerosis.  IMPRESSION: 1. Further evolution of the large right middle cerebral artery distribution stroke, with minimal petechial hemorrhage at the anterior extent of the infarct in the posterior left frontal lobe as a result of luxury perfusion. No significant intracranial hemorrhage. 2. Mass effect with slight shift of the midline to the left approximating 2-3 mm. No evidence of transtentorial or uncal herniation. 3. No new intracranial abnormality elsewhere.   Electronically Signed   By: Kaitlyn Valdez M.D.   On: 04/26/2013 21:54   Dg Chest Port 1 View  04/27/2013   CLINICAL DATA:  Atelectasis  EXAM: PORTABLE CHEST - 1 VIEW  COMPARISON:  04/24/2013  FINDINGS: Left IJ high flow catheter extends to the cavoatrial junction as before. Lungs clear. Heart size normal. . No effusion. Mild thoracolumbar levoscoliosis possibly positional.  IMPRESSION: No acute cardiopulmonary disease.   Electronically Signed   By: Kaitlyn Valdez M.D.   On:  04/27/2013 08:24    Labs: BMET  Recent Labs Lab 04/22/13 2105  04/23/13 1541  04/24/13 0313  04/25/13 0440 04/25/13 1309 04/26/13 0449 04/26/13 1219 04/27/13 0400 04/27/13 1342 04/28/13 0500  NA 139  < > 144  < > 153*  155*  < > 152* 152* 154* 151* 151* 153* 152*  K 4.6  < > 3.5*  --  3.8  < > 3.0* 2.8* 3.4* 3.3* 4.0 3.4* 3.2*  CL 101  < > 102  --  117*  < >  110 104 110 105 110 103 108  CO2 21  --  29  --  25  --  29  --  30  --  26  --  30  GLUCOSE 107*  < > 91  --  107*  < > 114* 112* 109* 106* 117* 91 89  BUN 26*  < > 30*  --  28*  < > 27* 27* 25* 23 21 22 17   CREATININE 2.49*  < > 2.67*  --  2.38*  < > 2.31* 2.10* 2.10* 2.00* 1.83* 1.90* 1.87*  CALCIUM 9.7  --  8.9  --  8.3*  --  8.2*  --  8.2*  --  8.5  --  8.7  PHOS 5.4*  --  4.4  --  4.0  --  3.3  --  3.6  --  3.9  --  3.9  < > = values in this interval not displayed. CBC  Recent Labs Lab 04/22/13 2105  04/26/13 1502  04/27/13 0400 04/27/13 1342 04/27/13 1815 04/28/13 0400  WBC 6.4  < > 5.5  --  5.7  --  7.0 6.1  NEUTROABS 3.8  --   --   --   --   --   --   --   HGB 11.6*  < > 6.7*  < > 9.0* 8.8* 7.8* 8.4*  HCT 33.4*  < > 20.3*  < > 26.9* 26.0* 23.9* 25.8*  MCV 91.0  < > 94.4  --  93.4  --  94.8 95.9  PLT 10*  < > 94*  --  110*  --  131* 168  < > = values in this interval not displayed.  Results for Kaitlyn AlmasALALAH, [name] (MRN [id number]) as of 04/28/2013 09:09  Ref. Range 04/22/2013 21:05 04/24/2013 03:13 04/25/2013 04:40 04/26/2013 04:49 04/27/2013 04:00 04/28/2013 04:00  LDH Latest Range: 94-250 U/L 946 (H) 301 (H) 301 (H) 269 (H) 285 (H) 278 (H)    Results for Kaitlyn AlmasALALAH, [name] (MRN [id number]) as of 04/28/2013 09:09  Ref. Range 04/22/2013 23:59  Kaitlyn Valdez 13 Activity Latest Range: 68-163 % Activity 12 (L)    Medications:    . anticoagulant sodium citrate  5 mL Intracatheter Once  . calcium carbonate  2 tablet Oral Q3H  . calcium gluconate IVPB  4 g Intravenous Once  . calcium gluconate IVPB  4 g Intravenous Once  . calcium gluconate  2 g Intravenous Once  . citrate dextrose      . feeding supplement (ENSURE)  1 Container Oral TID BM  . pantoprazole  40 mg Oral Daily  . 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  TTP  By history relapsing (though those details have been unable to be obtained as pt unable to tell us who MD was, but able to say had plasma exchange in the past in [facility]) Pt with pentad consistent with TTP.  Kaitlyn Valdez 13 low (12%). (commonly this low in relapsing TTP)   LDH trending down, platelets and renal function improving with PLEX.   Low ADAMTS13 makes idiopathic more likely than drug related (Plavix was considered)  Heme wants PLEX through Sunday (today)  (6 treatments), then they will return on Monday to make additional recommendations.   Spoke with Dr. Bertis Ruddy 1/30  who will make recommendations regarding future pheresis plan on Monday.   If the plan is for an every other day program or outpt program for any period of time, would need a tunnelled catheter.   Heme needs assistance with writing orders which we can do as long as in the inpt environment but if needs outpt will need a different arrangement as she is getting for a non-renal indication and we would not be comfortable directing outpt therapy She will clearly need outpt hematology followup if this is indeed a relapsing TTP - will need ROUTINE as opposed to prn f/u of plts, LDH, etc  H/O of CVA's with new R MCA stroke, L sided neglect.   Neuro following and evaluating.   Remains altered  HTN Somewhat labile No meds at present  Induced hypernatremia  Per neuro who treated with hypertonic saline. Stopped 1/28.  They do not want not "correction" but rather allow to drift back up.  Bradycardia episode earlier this admit .   Could certainly have been secondary to TTP (intracardiac thrombi) which can cause arrhthymias and acute MI's.  Troponin elevated x 1 (0.34)    Jafeth Mustin B,MD 04/28/2013 8:54 AM

## 2013-04-28 NOTE — Progress Notes (Signed)
PT Cancellation Note  Patient Details Name: [name] T Lynne MRN: [id number] DOB: 06/16/1960   Cancelled Treatment:     Pt at dialysis this morning and continues to be on bed rest per RN. Will re-attempt to see pt later today or in am as time allows. Please update activity orders as appropriate.    Donnamarie PoagWest, Fredrik Mogel Oak[place], [place] CarolinaPT [phone] 04/28/2013, 10:42 AM

## 2013-04-28 NOTE — Progress Notes (Signed)
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 -No antithrombotics for secondary stroke prevention given TTP. -Neuro following.  TTP -Currently receiving daily plasmapheresis. -Plt up to 168 today. -Plan to continue TPE until plt>140. -Hematology following to determine remaining course of TPE.  Hypernatremia -Was induced by neuro for cerebral edema. -Drifiting down slowly.  Code Status: Full Family Communication: Patient only  Disposition Plan: To be dtermined   Consultants:  Renal  Heme/onc   Antibiotics:  None   Subjective: Left neglect, seems agitated this am.  Objective: Filed Vitals:   04/28/13 1141 04/28/13 1148 04/28/13 1157 04/28/13 1203  BP: 141/115 162/120 146/96 151/86  Pulse: 121 125 109 66  Temp: 99.3 F (37.4 C) 98.9 F (37.2 C) 99.4 F (37.4 C) 99.3 F (37.4 C)  TempSrc: Oral Oral Oral Oral  Resp:    18  Height:      Weight:      SpO2:   95% 98%    Intake/Output Summary (Last 24 hours) at 04/28/13 1402 Last data filed at 04/28/13 0500  Gross per 24 hour  Intake      0 ml  Output    950 ml  Net   -950 ml   Filed Weights   04/27/13 1400 04/28/13 0900 04/28/13 1113  Weight: 64.4 kg (141 lb 15.6 oz) 66.906 kg (147 lb 8 oz) 66.906 kg (147 lb 8 oz)    Exam:   General:  awake  Cardiovascular: RRR  Respiratory: CTA B  Abdomen: S/ND/+BS  Extremities: trace bilateral edema   Data Reviewed: Basic Metabolic Panel:  Recent Labs Lab 04/22/13 2105  04/24/13 0313  04/25/13 0440  04/26/13 0449 04/26/13 1219 04/27/13 0400 04/27/13 1342 04/28/13 0500 04/28/13 1028  NA 139  < > 153*  155*  < > 152*  < > 154* 151* 151* 153* 152* 150*  K 4.6  < > 3.8  < > 3.0*  < > 3.4* 3.3* 4.0 3.4* 3.2* 3.0*  CL 101  < > 117*  < > 110  < > 110 105 110 103 108 102  CO2 21  < > 25  --  29  --  30   --  26  --  30  --   GLUCOSE 107*  < > 107*  < > 114*  < > 109* 106* 117* 91 89 107*  BUN 26*  < > 28*  < > 27*  < > 25* 23 21 22 17 16   CREATININE 2.49*  < > 2.38*  < > 2.31*  < > 2.10* 2.00* 1.83* 1.90* 1.87* 1.90*  CALCIUM 9.7  < > 8.3*  --  8.2*  --  8.2*  --  8.5  --  8.7  --   MG 2.0  --   --   --   --   --   --   --   --   --   --   --   PHOS 5.4*  < > 4.0  --  3.3  --  3.6  --  3.9  --  3.9  --   < > = values in this interval not displayed. Liver Function Tests:  Recent Labs Lab 04/22/13 2105  04/24/13 0313 04/25/13 0440 04/26/13 0449 04/27/13 0400 04/28/13 0500  AST 54*  --   --  39*  --   --   --  ALT 28  --   --  21  --   --   --   ALKPHOS 70  --   --  65  --   --   --   BILITOT 1.9*  --   --  0.7  --   --   --   PROT 7.4  --   --  5.9*  --   --   --   ALBUMIN 4.0  < > 3.1* 3.4* 3.2* 3.5 3.5  < > = values in this interval not displayed. No results found for this basename: LIPASE, AMYLASE,  in the last 168 hours No results found for this basename: AMMONIA,  in the last 168 hours CBC:  Recent Labs Lab 04/22/13 2105  04/26/13 0449  04/26/13 1502  04/27/13 0400 04/27/13 1342 04/27/13 1815 04/28/13 0400 04/28/13 1028  WBC 6.4  < > 7.3  --  5.5  --  5.7  --  7.0 6.1  --   NEUTROABS 3.8  --   --   --   --   --   --   --   --   --   --   HGB 11.6*  < > 8.6*  < > 6.7*  < > 9.0* 8.8* 7.8* 8.4* 15.0  HCT 33.4*  < > 25.6*  < > 20.3*  < > 26.9* 26.0* 23.9* 25.8* 44.0  MCV 91.0  < > 92.8  --  94.4  --  93.4  --  94.8 95.9  --   PLT 10*  < > 95*  --  94*  --  110*  --  131* 168  --   < > = values in this interval not displayed. Cardiac Enzymes:  Recent Labs Lab 04/25/13 1010 04/25/13 1610 04/25/13 2135  TROPONINI 0.34* <0.30 <0.30   BNP (last 3 results) No results found for this basename: PROBNP,  in the last 8760 hours CBG:  Recent Labs Lab 04/24/13 1148 04/24/13 1547 04/24/13 1914 04/24/13 2359 04/28/13 1220  GLUCAP 121* 180* 171* 106* 94    Recent  Results (from the past 240 hour(s))  MRSA PCR SCREENING     Status: None   Collection Time    04/22/13  7:04 PM      Result Value Range Status   MRSA by PCR NEGATIVE  NEGATIVE Final   Comment:            The GeneXpert MRSA Assay (FDA     approved for NASAL specimens     only), is one component of a     comprehensive MRSA colonization     surveillance program. It is not     intended to diagnose MRSA     infection nor to guide or     monitor treatment for     MRSA infections.  CULTURE, BLOOD (ROUTINE X 2)     Status: None   Collection Time    04/22/13 10:10 PM      Result Value Range Status   Specimen Description BLOOD HEMODIALYSIS CATHETER   Final   Special Requests BOTTLES DRAWN AEROBIC AND ANAEROBIC 10CC EACH   Final   Culture  Setup Time     Final   Value: 04/23/2013 03:39     [facility]   Culture     Final   Value:        BLOOD CULTURE RECEIVED NO GROWTH TO DATE CULTURE WILL BE HELD FOR 5 DAYS  BEFORE ISSUING A FINAL NEGATIVE REPORT     Performed at [facility]   Report Status PENDING   Incomplete  CULTURE, BLOOD (ROUTINE X 2)     Status: None   Collection Time    04/22/13 10:18 PM      Result Value Range Status   Specimen Description BLOOD LEFT FOREARM   Final   Special Requests BOTTLES DRAWN AEROBIC ONLY 5CC   Final   Culture  Setup Time     Final   Value: 04/23/2013 03:03     Performed at [facility]   Culture     Final   Value:        BLOOD CULTURE RECEIVED NO GROWTH TO DATE CULTURE WILL BE HELD FOR 5 DAYS BEFORE ISSUING A FINAL NEGATIVE REPORT     Performed at [facility]   Report Status PENDING   Incomplete  URINE CULTURE     Status: None   Collection Time    04/23/13 11:57 AM      Result Value Range Status   Specimen Description URINE, CATHETERIZED   Final   Special Requests NONE   Final   Culture  Setup Time     Final   Value: 04/23/2013 12:45     Performed at [facility] Count     Final    Value: NO GROWTH     Performed at [facility]   Culture     Final   Value: NO GROWTH     Performed at [facility]   Report Status 04/24/2013 FINAL   Final     Studies: Ct Head Wo Contrast  04/26/2013   CLINICAL DATA:  Follow-up right middle cerebral artery distribution stroke.  EXAM: CT HEAD WITHOUT CONTRAST  TECHNIQUE: Contiguous axial images were obtained from the base of the skull through the vertex without intravenous contrast.  COMPARISON:  CT HEAD W/O CM dated 04/23/2013  FINDINGS: Further evolution of the large right middle cerebral artery distribution stroke, with involvement of the right temporal, frontal and parietal lobes. Minimal petechial hemorrhage at the anterior extent of the stroke in the posterior right frontal lobe consistent with rib bloods reperfusion. Mass effect with effacement of cortical sulci in the right cerebral hemisphere, slight compression of the right lateral ventricle, and slight shift of the midline to the left approximating 2-3 mm. No evidence of uncal or transtentorial herniation.  Encephalomalacia involving the left frontal lobe, left parietal lobe, and left occipital lobe, related to old cortical strokes. No evidence of hydrocephalus. No new intracranial abnormality elsewhere.  No focal osseous abnormality involving the skull. Mucous retention cyst or polyp in the left maxillary sinus and minimal mucosal thickening involving the left sphenoid sinus, unchanged. Remaining paranasal sinuses, bilateral mastoid air cells and bilateral middle ear cavities well aerated. Bilateral carotid siphon atherosclerosis.  IMPRESSION: 1. Further evolution of the large right middle cerebral artery distribution stroke, with minimal petechial hemorrhage at the anterior extent of the infarct in the posterior left frontal lobe as a result of luxury perfusion. No significant intracranial hemorrhage. 2. Mass effect with slight shift of the midline to the left approximating  2-3 mm. No evidence of transtentorial or uncal herniation. 3. No new intracranial abnormality elsewhere.   Electronically Signed   By: Hulan Saas M.D.   On: 04/26/2013 21:54   Dg Chest Port 1 View  04/27/2013   CLINICAL DATA:  Atelectasis  EXAM: PORTABLE CHEST -  1 VIEW  COMPARISON:  04/24/2013  FINDINGS: Left IJ high flow catheter extends to the cavoatrial junction as before. Lungs clear. Heart size normal. . No effusion. Mild thoracolumbar levoscoliosis possibly positional.  IMPRESSION: No acute cardiopulmonary disease.   Electronically Signed   By: Oley Balm M.D.   On: 04/27/2013 08:24    Scheduled Meds: . calcium gluconate IVPB  4 g Intravenous Once  . calcium gluconate IVPB  4 g Intravenous Once  . calcium gluconate  2 g Intravenous Once  . feeding supplement (ENSURE)  1 Container Oral TID BM  . pantoprazole  40 mg Oral Daily  . simvastatin  20 mg Oral q1800   Continuous Infusions: . citrate dextrose    . citrate dextrose      Principal Problem:   TTP (thrombotic thrombocytopenic purpura) Active Problems:   Acute encephalopathy   History of CVA (cerebrovascular accident)   Depression   Essential hypertension, benign   Arterial ischemic stroke, MCA (middle cerebral artery), right, acute    Time spent: 25 minutes. Greater than 50% of this time was spent in direct contact with the patient coordinating care.    Chaya Jan  [facility] Pager [phone]  If 7PM-7AM, please contact night-coverage at [email or web address], password [facility] 04/28/2013, 2:02 PM  LOS: 6 days

## 2013-04-29 DIAGNOSIS — M311 Thrombotic microangiopathy, unspecified: Secondary | ICD-10-CM

## 2013-04-29 DIAGNOSIS — I6350 Cerebral infarction due to unspecified occlusion or stenosis of unspecified cerebral artery: Secondary | ICD-10-CM

## 2013-04-29 LAB — CULTURE, BLOOD (ROUTINE X 2)
CULTURE: NO GROWTH
Culture: NO GROWTH

## 2013-04-29 LAB — THERAPEUTIC PLASMA EXCHANGE (BLOOD BANK)
PLASMA VOLUME NEEDED: 3900
Plasma Exchange: 3900
UNIT DIVISION: 0
UNIT DIVISION: 0
UNIT DIVISION: 0
UNIT DIVISION: 0
UNIT DIVISION: 0
UNIT DIVISION: 0
Unit division: 0
Unit division: 0
Unit division: 0
Unit division: 0
Unit division: 0
Unit division: 0
Unit division: 0

## 2013-04-29 LAB — RENAL FUNCTION PANEL
ALBUMIN: 3.4 g/dL — AB (ref 3.5–5.2)
BUN: 13 mg/dL (ref 6–23)
CHLORIDE: 104 meq/L (ref 96–112)
CO2: 28 mEq/L (ref 19–32)
Calcium: 8.5 mg/dL (ref 8.4–10.5)
Creatinine, Ser: 1.76 mg/dL — ABNORMAL HIGH (ref 0.50–1.10)
GFR, EST AFRICAN AMERICAN: 37 mL/min — AB (ref >90)
GFR, EST NON AFRICAN AMERICAN: 32 mL/min — AB (ref >90)
Glucose, Bld: 86 mg/dL (ref 70–99)
PHOSPHORUS: 4.2 mg/dL (ref 2.3–4.6)
POTASSIUM: 2.9 meq/L — AB (ref 3.7–5.3)
Sodium: 147 mEq/L (ref 137–147)

## 2013-04-29 LAB — CBC
HCT: 24.3 % — ABNORMAL LOW (ref 36.0–46.0)
HCT: 21.7 % — ABNORMAL LOW (ref 36.0–46.0)
Hemoglobin: 8.2 g/dL — ABNORMAL LOW (ref 12.0–15.0)
Hemoglobin: 7.2 g/dL — ABNORMAL LOW (ref 12.0–15.0)
MCH: 32 pg (ref 26.0–34.0)
MCH: 31.3 pg (ref 26.0–34.0)
MCHC: 33.7 g/dL (ref 30.0–36.0)
MCHC: 33.2 g/dL (ref 30.0–36.0)
MCV: 94.9 fL (ref 78.0–100.0)
MCV: 94.3 fL (ref 78.0–100.0)
PLATELETS: 176 10*3/uL (ref 150–400)
Platelets: 184 10*3/uL (ref 150–400)
RBC: 2.56 MIL/uL — AB (ref 3.87–5.11)
RBC: 2.3 MIL/uL — AB (ref 3.87–5.11)
RDW: 15.3 % (ref 11.5–15.5)
RDW: 15.4 % (ref 11.5–15.5)
WBC: 5.8 10*3/uL (ref 4.0–10.5)
WBC: 5.9 10*3/uL (ref 4.0–10.5)

## 2013-04-29 LAB — MAGNESIUM: MAGNESIUM: 1.2 mg/dL — AB (ref 1.5–2.5)

## 2013-04-29 LAB — LACTATE DEHYDROGENASE: LDH: 289 U/L — AB (ref 94–250)

## 2013-04-29 MED ORDER — CLONAZEPAM 1 MG PO TABS
1.0000 mg | ORAL_TABLET | Freq: Once | ORAL | Status: AC
  Administered 2013-04-29: 1 mg via ORAL
  Filled 2013-04-29: qty 1

## 2013-04-29 MED ORDER — SODIUM CHLORIDE 0.9 % IV SOLN
INTRAVENOUS | Status: DC
  Administered 2013-04-30: 500 mL via INTRAVENOUS

## 2013-04-29 MED ORDER — POTASSIUM CHLORIDE CRYS ER 20 MEQ PO TBCR
40.0000 meq | EXTENDED_RELEASE_TABLET | Freq: Once | ORAL | Status: AC
Start: 2013-04-29 — End: 2013-04-29

## 2013-04-29 MED ORDER — POTASSIUM CHLORIDE CRYS ER 20 MEQ PO TBCR
40.0000 meq | EXTENDED_RELEASE_TABLET | ORAL | Status: AC
  Administered 2013-04-29 (×3): 40 meq via ORAL
  Filled 2013-04-29 (×3): qty 2

## 2013-04-29 NOTE — Consult Note (Signed)
[facility] NOTE   MRN : [id number]  Reason for Consult: Diatek catheter for plasmapheresis.  Referring Physician: Artis Delay   History of Present Illness: This is a 53 y/o female She was admitted secondary new onset CVA left sided neglect.  Past medical history includes hypertension, hypercholesterolemia and TTP.  We have been ask to provide a diatek catheter for daily plasmapheresis.  She currently takes pravastatin and plavix.       Current Facility-Administered Medications  Medication Dose Route Frequency Provider Last Rate Last Dose  . acetaminophen (TYLENOL) tablet 650 mg  650 mg Oral Q4H PRN [name], MD      . acetaminophen (TYLENOL) tablet 650 mg  650 mg Oral Q4H PRN [name], MD      . calcium gluconate 4 g in sodium chloride 0.9 % 250 mL IVPB  4 g Intravenous Once [name], MD      . calcium gluconate inj 10% (1 g) URGENT USE ONLY!  2 g Intravenous Once [name], MD      . citrate dextrose (ACD-A anticoagulant) solution 500 mL  500 mL Intravenous Continuous [name], MD   500 mL at 04/25/13 1445  . citrate dextrose (ACD-A anticoagulant) solution 500 mL  500 mL Intravenous Continuous [name], MD   500 mL at 04/27/13 1533  . diphenhydrAMINE (BENADRYL) capsule 25 mg  25 mg Oral Q6H PRN [name], MD      . diphenhydrAMINE (BENADRYL) injection 25 mg  25 mg Intravenous Q6H PRN Lonia Farber, MD   25 mg at 04/27/13 2122  . feeding supplement (ENSURE) (ENSURE) pudding 1 Container  1 Container Oral TID BM Hettie Holstein, RD   1 Container at 04/28/13 2000  . haloperidol lactate (HALDOL) injection 2 mg  2 mg Intravenous Q4H PRN Courtney Paris, MD   2 mg at 04/27/13 2122  . ondansetron (ZOFRAN) injection 4 mg  4 mg Intravenous Q8H PRN Courtney Paris, MD   4 mg at 04/22/13 2239  . pantoprazole (PROTONIX) EC tablet 40 mg  40 mg Oral Daily Henderson Cloud, MD   40 mg at 04/28/13 2000  .  potassium chloride SA (K-DUR,KLOR-CON) CR tablet 40 mEq  40 mEq Oral Q4H Vassie Loll, MD   40 mEq at 04/29/13 0943  . RESOURCE THICKENUP CLEAR   Oral PRN Nelda Bucks, MD      . simvastatin (ZOCOR) tablet 20 mg  20 mg Oral q1800 David L Rinehuls, PA-C   20 mg at 04/28/13 1720    Pt meds include: Statin :Yes Betablocker: No ASA: No Other anticoagulants/antiplatelets: Plavix  Past Medical History  Diagnosis Date  . CVA (cerebral vascular accident)   . Depression   . Hypertension   . Hypercholesteremia   . TTP (thrombotic thrombopenic purpura)     No past surgical history on file.  Social History History  Substance Use Topics  . Smoking status: Not on file  . Smokeless tobacco: Not on file  . Alcohol Use: Not on file    Family History Family History  Problem Relation Age of Onset  . Hypertension Mother   . Hyperlipidemia Mother   . Hypertension Father     Allergies  Allergen Reactions  . Penicillins Anaphylaxis     REVIEW OF SYSTEMS  General: [ ]  Weight loss, [ ]  Fever, [ ]  chills Neurologic: [ ]  Dizziness, [ ]  Blackouts, [ ]   Seizure [x ] Stroke, [ ]  "Mini stroke", [x ] Slurred speech, [ ]  Temporary blindness; [ ]  weakness in arms or legs, [ ]  Hoarseness [ ]  Dysphagia Cardiac: [ ]  Chest pain/pressure, [ ]  Shortness of breath at rest [ ]  Shortness of breath with exertion, [ ]  Atrial fibrillation or irregular heartbeat  Vascular: [ ]  Pain in legs with walking, [ ]  Pain in legs at rest, [ ]  Pain in legs at night,  [ ]  Non-healing ulcer, [ ]  Blood clot in vein/DVT,   Pulmonary: [ ]  Home oxygen, [ ]  Productive cough, [ ]  Coughing up blood, [ ]  Asthma,  [ ]  Wheezing [ ]  COPD Musculoskeletal:  [ ]  Arthritis, [ ]  Low back pain, [ ]  Joint pain Hematologic: [ ]  Easy Bruising, [ ]  Anemia; [ ]  Hepatitis [x]  TTP Gastrointestinal: [ ]  Blood in stool, [ ]  Gastroesophageal Reflux/heartburn, Urinary: [ ]  chronic Kidney disease, [ ]  on HD - [ ]  MWF or [ ]  TTHS, [ ]  Burning  with urination, [ ]  Difficulty urinating Skin: [ ]  Rashes, [ ]  Wounds Psychological: [ ]  Anxiety, [x ] Depression  Physical Examination Filed Vitals:   04/28/13 1605 04/28/13 2000 04/29/13 0534 04/29/13 0612  BP: 143/84 155/89  150/60  Pulse: 81 77  85  Temp: 98.9 F (37.2 C) 100.1 F (37.8 C)  98.9 F (37.2 C)  TempSrc: Oral Oral  Oral  Resp: 18 20  18   Height:      Weight:   146 lb 13.2 oz (66.6 kg)   SpO2: 92% 94%  95%   Body mass index is 24.43 kg/(m^2).  General:  WDWN in NAD Alert but unable to communicate person or time, she does know place Gait: unkowen HENT: WNL Eyes: Pupils equal Pulmonary: normal non-labored breathing , without Rales, rhonchi,  wheezing Cardiac: RRR, without  Murmurs, rubs or gallops; No carotid bruits Abdomen: soft, NT, no masses Skin: no rashes, ulcers noted;  no Gangrene , no cellulitis; no open wounds;   Vascular Exam/Pulses:Palpable radial, brachial, femoral DP/PT   Musculoskeletal: no muscle wasting or atrophy; no edema Left sided neglect left arm sever flacid , she does move the left leg.   Neurologic: follows simple commands  SENSATION: normal; with right facial droop MOTOR FUNCTION: 5/5 Symmetric right side upper and lower , Left upper arm flaccid.  Will move left leg Speech is fluent/normal   Significant Diagnostic Studies: CBC Lab Results  Component Value Date   WBC 5.8 04/29/2013   HGB 8.2* 04/29/2013   HCT 24.3* 04/29/2013   MCV 94.9 04/29/2013   PLT 184 04/29/2013    BMET    Component Value Date/Time   NA 147 04/29/2013 0500   K 2.9* 04/29/2013 0500   CL 104 04/29/2013 0500   CO2 28 04/29/2013 0500   GLUCOSE 86 04/29/2013 0500   BUN 13 04/29/2013 0500   CREATININE 1.76* 04/29/2013 0500   CALCIUM 8.5 04/29/2013 0500   GFRNONAA 32* 04/29/2013 0500   GFRAA 37* 04/29/2013 0500   Estimated Creatinine Clearance: 32.9 ml/min (by C-G formula based on Cr of 1.76).  COAG Lab Results  Component Value Date   INR 1.11 04/22/2013   CT head:   Large nonhemorrhagic acute right middle cerebral artery distribution  infarct with slight compression of the right lateral ventricle  without midline shift. Patient is at risk for development of further  mass effect/hemorrhage as the infarct evolves.   Remote infarct with encephalomalacia left posterior temporal  -parietal  lobe.    Non-Invasive Vascular Imaging:  Carotid duplex negative for stenosis Bilateral - 1% to 39% ICA stenosis lowest end of scale. Vertebral aretery flow is antegrade.    ASSESSMENT/PLAN:  Right CVA TTP with need for diatek catheter for out patient Plasmapheresis. The patient is unable to sign for her self, I have asked nursing to find out who her power of attorney is.  Clinton GallantCOLLINS, EMMA [facility] 04/29/2013 11:11 AM  I have examined the patient, reviewed and agree with above. Patient requires cuffed catheter for plasmapheresis. Attempted to explain the patient's very poor understanding with the new major stroke. We'll obtain consent from family. Can place a new catheter at any time. Will plan placement following consent.   Laveda Demedeiros, MD 04/29/2013 3:05 PM

## 2013-04-29 NOTE — Progress Notes (Signed)
Stroke Team Progress Note  HISTORY Kaitlyn Valdez is an 53 y.o. female with history of stroke that affected her RIGHT side per family member who are at bedside. Daughter states she had been talking to her on the phone between the hours of 9-10 AM and she was speaking normally. Her aunt found patient around 3311 AM 04/21/2013 down at home, and per note she was confused, showed slurred speech and lever left sided neglect. Family at bedside state the left sided neglect is new. [facility] due to PLT count 22 and concern for TTP. TPE was initiated and patient was [facility]. Neurology was consulted concerning possibility of adding Plavix to TPE for embolic prevention.  Patient was not administerd TPA secondary to PLT count of 10. She was admitted to the ICU for further evaluation and treatment.  SUBJECTIVE No family at bedside. Patient up in bed, talking.  OBJECTIVE Most recent Vital Signs: Filed Vitals:   04/28/13 1605 04/28/13 2000 04/29/13 0534 04/29/13 0612  BP: 143/84 155/89  150/60  Pulse: 81 77  85  Temp: 98.9 F (37.2 C) 100.1 F (37.8 C)  98.9 F (37.2 C)  TempSrc: Oral Oral  Oral  Resp: 18 20  18   Height:      Weight:   66.6 kg (146 lb 13.2 oz)   SpO2: 92% 94%  95%   CBG (last 3)   Recent Labs  04/28/13 1220  GLUCAP 94    IV Fluid Intake:   . citrate dextrose    . citrate dextrose      MEDICATIONS  . calcium gluconate IVPB  4 g Intravenous Once  . calcium gluconate  2 g Intravenous Once  . feeding supplement (ENSURE)  1 Container Oral TID BM  . pantoprazole  40 mg Oral Daily  . potassium chloride  40 mEq Oral Q4H  . simvastatin  20 mg Oral q1800   PRN:  acetaminophen, acetaminophen, diphenhydrAMINE, diphenhydrAMINE, haloperidol lactate, ondansetron (ZOFRAN) IV, RESOURCE THICKENUP CLEAR  Diet:  Dysphagia 1 nectar thick liquids Activity:  OOB DVT Prophylaxis:  SCDs   CLINICALLY SIGNIFICANT STUDIES Basic  Metabolic Panel:  Recent Labs Lab 04/22/13 2105  04/28/13 0500 04/28/13 1028 04/29/13 0500  NA 139  < > 152* 150* 147  K 4.6  < > 3.2* 3.0* 2.9*  CL 101  < > 108 102 104  CO2 21  < > 30  --  28  GLUCOSE 107*  < > 89 107* 86  BUN 26*  < > 17 16 13   CREATININE 2.49*  < > 1.87* 1.90* 1.76*  CALCIUM 9.7  < > 8.7  --  8.5  MG 2.0  --   --   --   --   PHOS 5.4*  < > 3.9  --  4.2  < > = values in this interval not displayed. Liver Function Tests:  Recent Labs Lab 04/22/13 2105  04/25/13 0440  04/28/13 0500 04/29/13 0500  AST 54*  --  39*  --   --   --   ALT 28  --  21  --   --   --   ALKPHOS 70  --  65  --   --   --   BILITOT 1.9*  --  0.7  --   --   --   PROT 7.4  --  5.9*  --   --   --   ALBUMIN 4.0  < >  3.4*  < > 3.5 3.4*  < > = values in this interval not displayed. CBC:  Recent Labs Lab 04/22/13 2105  04/28/13 1910 04/29/13 0400  WBC 6.4  < > 6.0 5.8  NEUTROABS 3.8  --   --   --   HGB 11.6*  < > 7.8* 8.2*  HCT 33.4*  < > 24.1* 24.3*  MCV 91.0  < > 95.6 94.9  PLT 10*  < > 185 184  < > = values in this interval not displayed. Coagulation:   Recent Labs Lab 04/22/13 2105  LABPROT 14.1  INR 1.11   Cardiac Enzymes:   Recent Labs Lab 04/25/13 1010 04/25/13 1610 04/25/13 2135  TROPONINI 0.34* <0.30 <0.30   Urinalysis:   Recent Labs Lab 04/26/13 1057 04/26/13 1443  COLORURINE YELLOW YELLOW  LABSPEC 1.017 1.015  PHURINE 8.5* 8.5*  GLUCOSEU NEGATIVE NEGATIVE  HGBUR SMALL* SMALL*  BILIRUBINUR NEGATIVE NEGATIVE  KETONESUR NEGATIVE NEGATIVE  PROTEINUR 30* NEGATIVE  UROBILINOGEN 1.0 1.0  NITRITE NEGATIVE NEGATIVE  LEUKOCYTESUR NEGATIVE NEGATIVE   Lipid Panel    Component Value Date/Time   CHOL 122 04/24/2013 0313   TRIG 99 04/24/2013 0313   HDL 37* 04/24/2013 0313   CHOLHDL 3.3 04/24/2013 0313   VLDL 20 04/24/2013 0313   LDLCALC 65 04/24/2013 0313   HgbA1C  Lab Results  Component Value Date   HGBA1C 4.8 04/23/2013    Urine Drug Screen:   No  results found for this basename: labopia,  cocainscrnur,  labbenz,  amphetmu,  thcu,  labbarb    Alcohol Level: No results found for this basename: ETH,  in the last 168 hours  No results found.  CT of the brain  04/26/2013  1. Further evolution of the large right middle cerebral artery distribution stroke, with minimal petechial hemorrhage at the anterior extent of the infarct in the posterior left frontal lobe as a result of luxury perfusion. No significant intracranial hemorrhage. 2. Mass effect with slight shift of the midline to the left approximating 2-3 mm. No evidence of transtentorial or uncal herniation. 3. No new intracranial abnormality elsewhere. 04/23/2013 Exam is motion degraded. Large nonhemorrhagic acute right middle cerebral artery distribution infarct with slight compression of the right lateral ventricle without midline shift. Patient is at risk for development of further mass effect/hemorrhage as the infarct evolves. Remote infarct with encephalomalacia left posterior temporal -parietal lobe.   MRI of the brain    MRA of the brain    2D Echocardiogram  EF 60-65% with no source of embolus.   Carotid Doppler  Bilateral: 1-39% ICA stenosis. Vertebral artery flow is antegrade.   TCD This was a abnormal transcranial Doppler study, with low right middle cerebral artery mean flow velocities suggesting distal stenosis and poor left temporal and occipital windows limiting evaluation..   TEE  CXR   04/27/2013 No acute cardiopulmonary disease.  04/24/2013 1. Slightly increased pulmonary vascular congestion without evidence of pulmonary edema. 2. Stable position of left IJ hemodialysis catheter.  04/22/2013 Left IJ dialysis catheter tip mid SVC level No acute finding   EKG  Sinus bradycardia with short PR. Nonspecific ST abnormality  Therapy Recommendations    Physical Exam   Elderly caucasian lady not in distress. Afebrile. Head is nontraumatic. Neck is supple without bruit.  Cardiac exam no murmur or gallop. Lungs are clear to auscultation. Distal pulses are well felt.  Neurological Exam : Awake alert. Right gaze preference but can look up to the left till  midline. Does not blink to threat on the left but does so on the right. Pupils 4 mm irregular but sluggishly reactive. Fundi were not visualized. Speech is dysarthric and she is disoriented and confused and distractible. Follows simple one-step commands only. Tongue is midline. Left lower facial weakness. Left hemiparesis but will withdraw left upper and lower extremity to painful stimuli. Purposeful movements and right side against gravity. Left plantar is upgoing right is downgoing. Diminished sensation on the left and significant left hemineglect. Gait was not tested.   ASSESSMENT Kaitlyn Valdez is a 53 y.o. female presenting with confusion, slurred speech, and left sided neglect. Imaging confirms a right wedge shaped MCA infarct. Infarct felt to be embolic given 3 prior infarcts in the left brain. Such large vessel infarcts unlikely to be associated with TTP which typically causes microangiopathy, especially recurrent strokes. Other differentials include: cardioembolic source, primary hypercoagulable source, etc. On clopidogrel 75 mg orally every day prior to admission. Now on no antithrombotics for secondary stroke prevention. Patient with resultant confusion (improved), dysarthria, neurologic neglect, dysphagia (improved), left arm hemiparesis and field cut. Work up underway.   Recurrent TTP , undergoing plasmaphoresis, Platelets improved 10 to 168  Severe anemia, Stable Hb at 8.4   Acute Renal failure, Cr 1.76  Induced hypernatremia in order to decrease cerebral edema, protocol stopped 04/24/2013, Na drifting down,  Na 150 this am  Hypertension   Hyperlipidemia, LDL 65, on pravachol 40 mg daily PTA, now on no statin as NPO, goal LDL < 100 (< 70 for diabetics)   Hx strokes - baseline L HP per family  suggests old right brain stroke; old left brain strokes seen on imaging   Depression  Hospital day # 7  TREATMENT/PLAN  No antithrombotics at this time for secondary stroke prevention due to TTP  TEE to look for embolic source. Arranged with [facility] for tomorrow.  If positive for PFO (patent foramen ovale), check bilateral lower extremity venous dopplers to rule out DVT as possible source of stroke. (I have made patient NPO after midnight tonight).  Rehab consult  Annie Main, MSN, RN, ANVP-BC, ANP-BC, [facility] Pager: [phone] 04/29/2013 8:52 AM  I have personally obtained a history, examined the patient, evaluated imaging results, and formulated the assessment and plan of care. I agree with the above. Delia Heady, MD

## 2013-04-29 NOTE — Progress Notes (Signed)
Rehab admissions - Please note that rehab MD consult is pending. Thanks,  Juliann MuleJanine Undrea Archbold, PT Rehabilitation Admissions Coordinator [phone]

## 2013-04-29 NOTE — Progress Notes (Addendum)
Clinical Social Work Department CLINICAL SOCIAL WORK PLACEMENT NOTE 04/29/2013  Patient:  [name],Shanine T  Account Number:  [id number] Admit date:  04/22/2013  Clinical Social Worker:  Sharol HarnessPOONUM Jadwiga Faidley, Theresia MajorsLCSWA  Date/time:  04/29/2013 02:30 PM  Clinical Social Work is seeking post-discharge placement for this patient at the following level of care:   SKILLED NURSING   (*CSW will update this form in Epic as items are completed)   04/29/2013  Patient/family provided with [facility] Department of Clinical Social Work's list of facilities offering this level of care within the geographic area requested by the patient (or if unable, by the patient's family).  04/29/2013  Patient/family informed of their freedom to choose among providers that offer the needed level of care, that participate in Medicare, Medicaid or managed care program needed by the patient, have an available bed and are willing to accept the patient.  04/29/2013  Patient/family informed of [facility]' ownership interest in [facility], as well as of the fact that they are under no obligation to receive care at this facility.  PASARR submitted to EDS on 04/29/2013 PASARR number received from EDS on 04/29/2013  FL2 transmitted to all facilities in geographic area requested by pt/family on  04/29/2013 FL2 transmitted to all facilities within larger geographic area on   Patient informed that his/her managed care company has contracts with or will negotiate with  certain facilities, including the following:     Patient/family informed of bed offers received:  05/01/2013 Patient chooses bed at [facility] Physician recommends and patient chooses bed at    Patient to be [facility] to [facility] on  05/01/2013 [facility]  The following physician request were entered in Epic:   Additional Comments:  Odell Fasching, LCSWA [phone]

## 2013-04-29 NOTE — Consult Note (Signed)
[facility]  [phone]    HOSPITAL PROGRESS NOTE I have seen the patient, examined her and agree with the documentation as outlined below: Events since 1/27 noted. Tolerated plasmapheresis last performed yesterday on 04/28/13. No bleeding issues. Denies pain. No respiratory or cardiac issues.Appears comfortable. Less oriented today with garbled speech and appeared somnolent   MEDICATIONS:  Scheduled Meds: . calcium gluconate IVPB  4 g Intravenous Once  . calcium gluconate  2 g Intravenous Once  . feeding supplement (ENSURE)  1 Container Oral TID BM  . pantoprazole  40 mg Oral Daily  . potassium chloride  40 mEq Oral Q4H  . simvastatin  20 mg Oral q1800   Continuous Infusions: . citrate dextrose    . citrate dextrose     PRN Meds:.acetaminophen, acetaminophen, diphenhydrAMINE, diphenhydrAMINE, haloperidol lactate, ondansetron (ZOFRAN) IV, RESOURCE THICKENUP CLEAR  ALLERGIES:   Allergies  Allergen Reactions  . Penicillins Anaphylaxis     PHYSICAL EXAMINATION:   Filed Vitals:   04/29/13 0612  BP: 150/60  Pulse: 85  Temp: 98.9 F (37.2 C)  Resp: 18   Weight change: 5 lb 8 oz (2.495 kg)  GENERAL:alert, no distress and comfortable  SKIN: skin color, texture, turgor are normal, noted some petechiae rash, no ischemic changes  EYES: normal, conjunctiva are pink and non-injected, sclera clear  OROPHARYNX:no exudate, no erythema and lips, buccal mucosa, and tongue normal  NECK: supple, thyroid normal size, non-tender, without nodularity  LYMPH: no palpable lymphadenopathy in the cervical, axillary or inguinal  LUNGS: clear to auscultation and percussion with normal breathing effort  HEART: regular rate & rhythm and no murmurs and no lower extremity edema  ABDOMEN:abdomen soft, non-tender and normal bowel sounds  Musculoskeletal:no cyanosis of digits and no clubbing  PSYCH: alert , only oriented in person with dysarthria  NEURO: Evidence of  hemi neglect and left-sided weakness  LABORATORY/RADIOLOGY DATA:   Recent Labs Lab 04/22/13 2105  04/27/13 0400  04/27/13 1815 04/28/13 0400 04/28/13 1028 04/28/13 1910 04/29/13 0400  WBC 6.4  < > 5.7  --  7.0 6.1  --  6.0 5.8  HGB 11.6*  < > 9.0*  < > 7.8* 8.4* 15.0 7.8* 8.2*  HCT 33.4*  < > 26.9*  < > 23.9* 25.8* 44.0 24.1* 24.3*  PLT 10*  < > 110*  --  131* 168  --  185 184  MCV 91.0  < > 93.4  --  94.8 95.9  --  95.6 94.9  MCH 31.6  < > 31.3  --  31.0 31.2  --  31.0 32.0  MCHC 34.7  < > 33.5  --  32.6 32.6  --  32.4 33.7  RDW 14.1  < > 15.9*  --  15.7* 15.5  --  15.6* 15.3  LYMPHSABS 1.1  --   --   --   --   --   --   --   --   MONOABS 1.1*  --   --   --   --   --   --   --   --   < > = values in this interval not displayed.  CMP    Recent Labs Lab 04/22/13 2105  04/25/13 0440  04/26/13 0449  04/27/13 0400 04/27/13 1342 04/28/13 0500 04/28/13 1028 04/29/13 0500  NA 139  < > 152*  < > 154*  < > 151* 153* 152* 150* 147  K 4.6  < > 3.0*  < >  3.4*  < > 4.0 3.4* 3.2* 3.0* 2.9*  CL 101  < > 110  < > 110  < > 110 103 108 102 104  CO2 21  < > 29  --  30  --  26  --  30  --  28  GLUCOSE 107*  < > 114*  < > 109*  < > 117* 91 89 107* 86  BUN 26*  < > 27*  < > 25*  < > 21 22 17 16 13   CREATININE 2.49*  < > 2.31*  < > 2.10*  < > 1.83* 1.90* 1.87* 1.90* 1.76*  CALCIUM 9.7  < > 8.2*  --  8.2*  --  8.5  --  8.7  --  8.5  MG 2.0  --   --   --   --   --   --   --   --   --  1.2*  AST 54*  --  39*  --   --   --   --   --   --   --   --   ALT 28  --  21  --   --   --   --   --   --   --   --   ALKPHOS 70  --  65  --   --   --   --   --   --   --   --   BILITOT 1.9*  --  0.7  --   --   --   --   --   --   --   --   < > = values in this interval not displayed.      Component Value Date/Time   BILITOT 0.7 04/25/2013 0440   BILIDIR <0.2 04/25/2013 0440   IBILI NOT CALCULATED 04/25/2013 0440      Recent Labs Lab 04/22/13 2105  INR 1.11      Urinalysis    Component  Value Date/Time   COLORURINE YELLOW 04/26/2013 1443   APPEARANCEUR CLEAR 04/26/2013 1443   LABSPEC 1.015 04/26/2013 1443   PHURINE 8.5* 04/26/2013 1443   GLUCOSEU NEGATIVE 04/26/2013 1443   HGBUR SMALL* 04/26/2013 1443   BILIRUBINUR NEGATIVE 04/26/2013 1443   KETONESUR NEGATIVE 04/26/2013 1443   PROTEINUR NEGATIVE 04/26/2013 1443   UROBILINOGEN 1.0 04/26/2013 1443   NITRITE NEGATIVE 04/26/2013 1443   LEUKOCYTESUR NEGATIVE 04/26/2013 1443    Drug  Liver Function Tests:  Recent Labs Lab 04/22/13 2105  04/25/13 0440 04/26/13 0449 04/27/13 0400 04/28/13 0500 04/29/13 0500  AST 54*  --  39*  --   --   --   --   ALT 28  --  21  --   --   --   --   ALKPHOS 70  --  65  --   --   --   --   BILITOT 1.9*  --  0.7  --   --   --   --   PROT 7.4  --  5.9*  --   --   --   --   ALBUMIN 4.0  < > 3.4* 3.2* 3.5 3.5 3.4*  < > = values in this interval not displayed. No results found for this basename: LIPASE, AMYLASE,  in the last 168 hours No results found for this basename: AMMONIA,  in the last 168 hours  CBG:  Recent Labs Lab 04/24/13 1148 04/24/13 1547 04/24/13  1914 04/24/13 2359 04/28/13 1220  GLUCAP 121* 180* 171* 106* 94    Cardiac Enzymes:  Recent Labs Lab 04/25/13 1010 04/25/13 1610 04/25/13 2135  TROPONINI 0.34* <0.30 <0.30     Radiology Studies:  Ct Head Wo Contrast  04/26/2013   CLINICAL DATA:  Follow-up right middle cerebral artery distribution stroke.  EXAM: CT HEAD WITHOUT CONTRAST  TECHNIQUE: Contiguous axial images were obtained from the base of the skull through the vertex without intravenous contrast.  COMPARISON:  CT HEAD W/O CM dated 04/23/2013  FINDINGS: Further evolution of the large right middle cerebral artery distribution stroke, with involvement of the right temporal, frontal and parietal lobes. Minimal petechial hemorrhage at the anterior extent of the stroke in the posterior right frontal lobe consistent with rib bloods reperfusion. Mass effect with  effacement of cortical sulci in the right cerebral hemisphere, slight compression of the right lateral ventricle, and slight shift of the midline to the left approximating 2-3 mm. No evidence of uncal or transtentorial herniation.  Encephalomalacia involving the left frontal lobe, left parietal lobe, and left occipital lobe, related to old cortical strokes. No evidence of hydrocephalus. No new intracranial abnormality elsewhere.  No focal osseous abnormality involving the skull. Mucous retention cyst or polyp in the left maxillary sinus and minimal mucosal thickening involving the left sphenoid sinus, unchanged. Remaining paranasal sinuses, bilateral mastoid air cells and bilateral middle ear cavities well aerated. Bilateral carotid siphon atherosclerosis.  IMPRESSION: 1. Further evolution of the large right middle cerebral artery distribution stroke, with minimal petechial hemorrhage at the anterior extent of the infarct in the posterior left frontal lobe as a result of luxury perfusion. No significant intracranial hemorrhage. 2. Mass effect with slight shift of the midline to the left approximating 2-3 mm. No evidence of transtentorial or uncal herniation. 3. No new intracranial abnormality elsewhere.   Electronically Signed   By: Hulan Saashomas  Lawrence M.D.   On: 04/26/2013 21:54   Ct Head Wo Contrast  04/23/2013   CLINICAL DATA:  53 year old female with hypertension and hypercholesterolemia down down with new left arm and leg weakness with slurred speech. TTP with platelets of 10.  EXAM: CT HEAD WITHOUT CONTRAST  TECHNIQUE: Contiguous axial images were obtained from the base of the skull through the vertex without intravenous contrast.  COMPARISON:  None.  FINDINGS: Exam is motion degraded.  Large nonhemorrhagic acute right middle cerebral artery distribution infarct involving portions of the right temporal lobe, right operculum region, right subinsular region, right frontal lobe and right parietal lobe. Local mass  effect with slight compression of the right lateral ventricle without midline shift.  Remote infarct with encephalomalacia left posterior temporal -parietal lobe.  Baseline atrophy without hydrocephalus.  No intracranial mass lesion noted on this unenhanced exam.  Mild mucosal thickening paranasal sinuses.  IMPRESSION: Exam is motion degraded.  Large nonhemorrhagic acute right middle cerebral artery distribution infarct with slight compression of the right lateral ventricle without midline shift. Patient is at risk for development of further mass effect/hemorrhage as the infarct evolves.  Remote infarct with encephalomalacia left posterior temporal -parietal lobe.  These results were called by telephone at the time of interpretation on 04/23/2013 at 3:16 PM to Dr. Amada JupiterKirkpatrick who verbally acknowledged these results.   Electronically Signed   By: Bridgett LarssonSteve  Olson M.D.   On: 04/23/2013 15:19   Dg Chest Port 1 View  04/27/2013   CLINICAL DATA:  Atelectasis  EXAM: PORTABLE CHEST - 1 VIEW  COMPARISON:  04/24/2013  FINDINGS:  Left IJ high flow catheter extends to the cavoatrial junction as before. Lungs clear. Heart size normal. . No effusion. Mild thoracolumbar levoscoliosis possibly positional.  IMPRESSION: No acute cardiopulmonary disease.   Electronically Signed   By: Oley Balm M.D.   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 Platelets today at 184. No plasmapheresis to be given today. VVS cconsultation requested for placement of vascular cath for outpatient pheresis, as she is to be likely discharged in the next few days if stable without plasmapheresis. I am concerned with physical examination today that she appeared to have deteriorated since last Friday. Will follow daily #2 anemia  Slightly worsening. Today is 7.2 from 7.8 on the prior day. No active bleeding issues. Discussed with primary service, consider transfusion if  hemoglobin <7 #3 acute renal failure in the setting of TTP S/P  plasmapheresis as above, improving #4 history of stroke    Plavix Not recommended due to risk of TTP with Plavix. Recommend to start her on aspirin  Will follow daily with LDH, CBC and hepatic panel, Will order a smear tomorrow.   Marcos Eke, PA-C 04/29/2013, 10:33 AM Bertis Ruddy, Rahmah Mccamy, MD 04/29/2013

## 2013-04-29 NOTE — Evaluation (Signed)
Speech Language Pathology Evaluation Patient Details Name: Kaitlyn Valdez MRN: [id number] DOB: 1961/03/07 Today's Date: 04/29/2013 Time: 1015-1030 SLP Time Calculation (min): 15 min  Problem List:  Patient Active Problem List   Diagnosis Date Noted  . Arterial ischemic stroke, MCA (middle cerebral artery), right, acute 04/23/2013  . TTP (thrombotic thrombocytopenic purpura) 04/22/2013  . Acute encephalopathy 04/22/2013  . History of CVA (cerebrovascular accident) 04/22/2013  . Depression 04/22/2013  . Essential hypertension, benign 04/22/2013   Past Medical History:  Past Medical History  Diagnosis Date  . CVA (cerebral vascular accident)   . Depression   . Hypertension   . Hypercholesteremia   . TTP (thrombotic thrombopenic purpura)    Past Surgical History: No past surgical history on file. HPI:  53 yo with past medical history of left CVA found down at home, covered in emesis, confused with slurred speech and  Left  sided neglect.  Workup revealed thrombocytopenia.  Head CT revealed no acute findings but multiple prior infarcts. Transferred to [facility] for plasma phoresis  Repeat CT head at [facility] revealed large nonhemorrhagic acute R MCA infarct.    Assessment / Plan / Recommendation Clinical Impression  Patient presents with significant cognitive linguistic deficits. Acute right CVA resulting in severely decreased sustained attention, awareness, problem solving, reasoning, and safety as well as severe left sided neglect/inattention impacting overall function and safety with ADLs. SLP provided max visual, verbal, and tactile cueing for attention to left visual field and increased sustained attention to functional tasks. Verbal communication notable for dysarthria as well as what appears to be aphasia (? baseline from previous left sided CVA) with frequent phonemic substitution, neologisms, initial phoneme hesitations, particulary at the phrase and sentence level. No aphasia documented  however in medical record. Could be a result of severely impacted cognitive status. SLP will f/u in the acute care setting. Recommend CIR after d/c to maximize potential for independent living.     SLP Assessment  Patient needs continued Speech Lanaguage Pathology Services    Follow Up Recommendations  Inpatient Rehab    Frequency and Duration min 2x/week  2 weeks   Pertinent Vitals/Pain n/a   SLP Goals  SLP Goals Potential to Achieve Goals: Good Potential Considerations: Severity of impairments Progress/Goals/Alternative treatment plan discussed with pt/caregiver and they: Agree  SLP Evaluation Prior Functioning  Cognitive/Linguistic Baseline: Information not available   Cognition  Overall Cognitive Status: Impaired/Different from baseline Arousal/Alertness: Awake/alert Orientation Level: Oriented to person;Disoriented to time (borderline awareness of place and situation) Attention: Sustained Sustained Attention: Impaired Sustained Attention Impairment: Verbal basic;Functional basic Memory: Impaired Memory Impairment: Decreased recall of new information;Decreased short term memory;Storage deficit;Retrieval deficit Decreased Short Term Memory: Verbal basic;Functional basic Awareness: Impaired Awareness Impairment: Intellectual impairment Problem Solving: Impaired Problem Solving Impairment: Verbal basic;Functional basic Executive Function: Reasoning;Self Monitoring;Self Correcting Reasoning: Impaired Reasoning Impairment: Verbal basic;Functional basic Self Monitoring: Impaired Self Monitoring Impairment: Verbal basic;Functional basic Self Correcting: Impaired Self Correcting Impairment: Verbal basic;Functional basic Behaviors: Impulsive Safety/Judgment: Impaired    Comprehension  Auditory Comprehension Overall Auditory Comprehension: Impaired Yes/No Questions: Impaired Basic Biographical Questions: 0-25% accurate Commands: Impaired One Step Basic Commands: 0-24%  accurate Interfering Components: Attention EffectiveTechniques: Visual/Gestural cues (contextual cues) Visual Recognition/Discrimination Discrimination: Not tested Reading Comprehension Reading Status:  (difficult to assess due to severity of left neglect/inattent)    Expression Expression Primary Mode of Expression: Verbal Verbal Expression Overall Verbal Expression: Impaired (? baseline impairements s/p left CVA) Initiation: No impairment Automatic Speech: Social Response Level of Generative/Spontaneous Verbalization: Sentence  Repetition: Impaired Level of Impairment: Word level Naming: Impairment Responsive: 0-25% accurate Confrontation: Impaired Convergent: 50-74% accurate Verbal Errors: Neologisms;Aware of errors (intermittently aware of errors, attempting to self correct) Interfering Components: Attention;Premorbid deficit (? premorbid aphasia?) Effective Techniques: Open ended questions Non-Verbal Means of Communication: Gestures Written Expression Written Expression: Not tested   Oral / Motor Oral Motor/Sensory Function Overall Oral Motor/Sensory Function: Impaired (left sided CN VII involvement) Motor Speech Overall Motor Speech: Impaired Respiration: Within functional limits Phonation: Normal Resonance: Within functional limits Articulation: Impaired Level of Impairment: Phrase Intelligibility: Intelligibility reduced Word: 75-100% accurate Phrase: 25-49% accurate Sentence: 25-49% accurate Conversation: 25-49% accurate Motor Planning: Impaired Level of Impairment: Word Motor Speech Errors: Aware;Inconsistent (inconsistently aware of stuttering/stammering of first sound) Interfering Components:  (? baseline s/p previous CVA vs cognitively based) Effective Techniques: Pause   GO    Gwenetta Devos MA, CCC-SLP [phone]  Stanislawa Gaffin Meryl 04/29/2013, 10:56 AM

## 2013-04-29 NOTE — Progress Notes (Addendum)
Clinical Social Work Department BRIEF PSYCHOSOCIAL ASSESSMENT 04/29/2013  Patient:  Kaitlyn Valdez,Kaitlyn Valdez     Account Number:  [id number]     Admit date:  04/22/2013  Clinical Social Worker:  Harless NakayamaAMBELAL,Estephania Licciardi, LCSWA  Date/Time:  04/29/2013 02:20 PM  Referred by:  Physician  Date Referred:  04/29/2013 Referred for  SNF Placement   Other Referral:   Interview type:  Family Other interview type:   Spoke with pt sister over the phone    PSYCHOSOCIAL DATA Living Status:  FAMILY Admitted from facility:   Level of care:   Primary support name:  Kaitlyn Valdez [phone] Primary support relationship to patient:  SIBLING Degree of support available:   Pt has supportive family    CURRENT CONCERNS Current Concerns  Post-Acute Placement   Other Concerns:    SOCIAL WORK ASSESSMENT / PLAN CSW made aware from RN CM that pt may need SNF placement. CSW spoke with pt sister since pt is not A&O x 4. Pt sister was understanding of recommendation. Pt sister had no concerns with pt discharging to SNF but did clarify with CSW that this was ST placement. Pt currently lives with her sister and pt sister is wanting her to return when appropriate. CSW explained SNF referral process. Pt sister requesting [place] fax[place] [facility] [place]  [facility] but cannot proceed with SNF until CIR decision is reached. [employer] aware that SNF is currently back up plan.   Assessment/plan status:  Psychosocial Support/Ongoing Assessment of Needs Other assessment/ plan:   Information/referral to community resources:   SNF list to be provided with bed offers    PATIENT'S/FAMILY'S RESPONSE TO PLAN OF CARE: Pt [facility]       Coraima Tibbs, LCSWA [phone]

## 2013-04-29 NOTE — Evaluation (Signed)
Physical Therapy Evaluation Patient Details Name: Kaitlyn Valdez MRN: [id number] DOB: 1961-02-23 Today's Date: 04/29/2013 Time: 4098-1191 PT Time Calculation (min): 23 min  PT Assessment / Plan / Recommendation History of Present Illness  53 y.o. female admitted to [facility] on 04/22/13 with history of stroke that affected her RIGHT side per family member who are at bedside. Daughter states she had been talking to her on the phone between the hours of 9-10 AM and she was speaking normally. Her aunt found patient around 65 AM 04/21/2013 down at home, and per note she was confused, showed slurred speech and lever left sided neglect. Family at bedside state the left sided neglect is new. Patient was transferred to [facility] hospital from [facility] due to PLT count 22 and concern for TTP. TPE was initiated and patient was brought to [facility] hospital. Neurology was consulted concerning possibility of adding Plavix to TPE for embolic prevention.  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  Encephalomalacia involving the left frontal lobe, left parietal lobe, and left occipital lobe, related to old cortical strokes.   Clinical Impression  Pt with significant L sided neglect, left sided weakness, safety issues and awareness issues.  I am unsure of her prior function or home situation.  She physically and cognitively presents as an excellent inpatient rehab candidate as she would benefit from very intensive 3 discipline therapy.   PT to follow acutely for deficits listed below.       PT Assessment  Patient needs continued PT services    Follow Up Recommendations  CIR    Does the patient have the potential to tolerate intense rehabilitation     Yes  Barriers to Discharge   Unsure of home environment, support people, or prior level of function.        Equipment Recommendations  Wheelchair (measurements PT);Wheelchair cushion (measurements  PT)    Recommendations for Other Services Rehab consult   Frequency Min 4X/week    Precautions / Restrictions Precautions Precautions: Fall Precaution Comments: very impulsive, L neglect with significant awareness issues.  Left sided weakness.    Pertinent Vitals/Pain See vitals flow sheet.      Mobility  Bed Mobility Overal bed mobility: Needs Assistance Bed Mobility: Supine to Sit Supine to sit: Min assist General bed mobility comments: min assist more for safety as she is moving quickly and almost scooted too far off of the bed during transition to sitting EOB.   Transfers Overall transfer level: Needs assistance Equipment used: None Transfers: Sit to/from [employer] Sit to Stand: Mod assist;+2 physical assistance Stand pivot transfers: +2 physical assistance;Mod assist General transfer comment: two person mod assist for safety and trunk support stood x 2 to preform pericare before transferring to the recliner chair on pt's right from the bed.  In standing, she leans to the left and needs support at her trunk for balance as well as her left knee blocked to prevent buckling.  In static standing, she can be a one person mod assist, but when going to transfer the second person is needed to help support her trunk to prevent a fall.  This would likely be worse if we were going to her left side.   Modified Rankin (Stroke Patients Only) Pre-Morbid Rankin Score:  (unknown) Modified Rankin: Severe disability        PT Diagnosis: Difficulty walking;Abnormality of gait;Generalized weakness;Acute pain;Hemiplegia non-dominant side;Altered mental status  PT Problem List: Decreased  strength;Decreased activity tolerance;Decreased mobility;Decreased balance;Decreased coordination;Decreased cognition;Decreased knowledge of use of DME;Decreased safety awareness;Decreased knowledge of precautions;Impaired sensation PT Treatment Interventions: DME instruction;Gait training;Functional  mobility training;Therapeutic activities;Therapeutic exercise;Balance training;Neuromuscular re-education;Cognitive remediation;Patient/family education     PT Goals(Current goals can be found in the care plan section) Acute Rehab PT Goals Patient Stated Goal: to go home PT Goal Formulation: Patient unable to participate in goal setting Time For Goal Achievement: 05/13/13 Potential to Achieve Goals: Good  Visit Information  Last PT Received On: 04/29/13 Assistance Needed: +1 History of Present Illness: 53 y.o. [facility] with history of stroke that affected her RIGHT side per family member who are at bedside. Daughter states she had been talking to her on the phone between the hours of 9-10 AM and she was speaking normally. Her aunt found patient around 7911 AM 04/21/2013 down at home, and per note she was confused, showed slurred speech and lever left sided neglect. Family at bedside state the left sided neglect is new. [facility] due to PLT count 22 and concern for TTP. TPE was initiated and [facility]. Neurology was consulted concerning possibility of adding Plavix to TPE for embolic prevention.  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  Encephalomalacia involving the left frontal lobe, left parietal lobe, and left occipital lobe, related to old cortical strokes.        Prior Functioning  Home Living Family/patient expects to be discharged to:: Unsure Additional Comments: Family not present to report and pt is a poor historian.   Communication Communication: Expressive difficulties (see SLP note for more details. )    Cognition  Cognition Overall Cognitive Status: Impaired/Different from baseline Area of Impairment: Attention;Following commands;Safety/judgement;Awareness Current Attention Level: Focused Following  Commands: Follows one step commands inconsistently Safety/Judgement: Decreased awareness of safety;Decreased awareness of deficits Awareness:  (She seems to show no awareness of deficits at all) General Comments: Pt is very quick to move, at times will not listen to repeated commands for safety and is very neglectful of her left side (even when touched with her right hand she does not recognize the left.  She is very distracted even in her room with the door closed by lines and tubes (pulling at her catheter and heart minitor until they are out of sight).  She was able to stand during peri care, but needed cues to focus her attention as she wanted to sit down several times before the task was over not just because of fatigue, but because she would forget why we were standing.  Same for sitting EOB, she countinously tried to stand and needed cues to sit and stay sitting while therapist assessed her.      Extremity/Trunk Assessment Upper Extremity Assessment Upper Extremity Assessment: Defer to OT evaluation Lower Extremity Assessment Lower Extremity Assessment: RLE deficits/detail;LLE deficits/detail RLE Deficits / Details: right leg is actively moving and she can move it to command.  Enough functional strength to hold herself in supported standing on her right.  LLE Deficits / Details: difficult to assess due to left neglect.  I could not get her to preform any MMT to command on the left side.  I did not observe any active movement of the left leg and functionally it is hyper extended in stance and she is dragging it behind as we transferred to the right side into the recliner chair.  LLE:  (unable to fully assess due to cognitive deficits) LLE Sensation: decreased light touch LLE Coordination: decreased fine motor;decreased gross motor Cervical / Trunk Assessment Cervical / Trunk Assessment: Normal   Balance Balance Overall balance assessment: Needs assistance Sitting-balance support: Single  extremity supported;Feet supported Sitting balance-Leahy Scale: Fair Standing balance support: Single extremity supported Standing balance-Leahy Scale: Poor General Comments General comments (skin integrity, edema, etc.): Due to impulsivity and decreased awareness of safety and her deficits We positioned the pt in the recliner chair with a chair alarm and a waist restraint. RN made aware as pt was not in a waist restraint (only a right hand mitten) when we came in the room.    End of Session PT - End of Session Equipment Utilized During Treatment: Gait belt Activity Tolerance: Patient tolerated treatment well Patient left: in chair;with call bell/phone within reach;with chair alarm set;with restraints reapplied Nurse Communication: Mobility status (two people for safety and go to her right side to transfer)    Lurena Joiner B. Wilburt Messina, PT, DPT [phone]   04/29/2013, 1:15 PM

## 2013-04-29 NOTE — Progress Notes (Signed)
Occupational Therapy Evaluation Patient Details Name: Kaitlyn Valdez MRN: [id number] DOB: 08-09-1960 Today's Date: 04/29/2013 Time: 1478-2956 OT Time Calculation (min): 32 min  OT Assessment / Plan / Recommendation History of present illness 53 y.o. female admitted to [facility] on 04/22/13 with history of stroke that affected her RIGHT side per family member who are at bedside. Daughter states she had been talking to her on the phone between the hours of 9-10 AM and she was speaking normally. Her aunt found patient around 70 AM 04/21/2013 down at home, and per note she was confused, showed slurred speech and lever left sided neglect. Family at bedside state the left sided neglect is new. Patient was transferred to [facility] hospital from [facility] due to PLT count 22 and concern for TTP. TPE was initiated and patient was brought to [facility] hospital. Neurology was consulted concerning possibility of adding Plavix to TPE for embolic prevention.  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  Encephalomalacia involving the left frontal lobe, left parietal lobe, and left occipital lobe, related to old cortical strokes.    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 Pt with new onset of LUE weakness and requires max to total A with ADL and mod A +2 with transfers. Non functional use LUE. Significant L neglect. L homonymous hemianopsia. Pt appears agitated at times, grasping and pulling tele wires with R hand. Pt needs intense rehab. If family can provide 24/7 assistance after D/C, then CIR will be an option. If fmaily is unable to provide this level of assistance, the pt will need SNF. Pt will benefit from skilled OT services to facilitate D/C to next venue due to below deficits.     OT Assessment  Patient needs continued OT Services    Follow Up Recommendations  CIR;Supervision/Assistance - 24 hour if family can provide physical assistance @ D/C   Barriers to Discharge  (unsure of level of support)    Equipment Recommendations       Recommendations for Other Services    Frequency  Min 2X/week    Precautions / Restrictions Precautions Precautions: Fall Precaution Comments: very impulsive, L neglect with significant awareness issues.  Left sided weakness.    Pertinent Vitals/Pain no apparent distress     ADL  Eating/Feeding: +1 Total assistance (requires hand over hand to fill spoon and bring to mouth) Where Assessed - Eating/Feeding: Chair Grooming: +1 Total assistance;Wash/dry hands;Wash/dry face Where Assessed - Grooming: Supported sitting Upper Body Bathing: +1 Total assistance Where Assessed - Upper Body Bathing: Supported sitting Lower Body Bathing: +1 Total assistance Where Assessed - Lower Body Bathing: Supported sit to stand Upper Body Dressing: +1 Total assistance Where Assessed - Upper Body Dressing: Supported sitting Lower Body Dressing: +1 Total assistance Where Assessed - Lower Body Dressing: Supported sit to Pharmacist, hospital: +2 Total assistance;Moderate assistance Toileting - Clothing Manipulation and Hygiene: +1 Total assistance Equipment Used: Gait belt Transfers/Ambulation Related to ADLs: +2 for any mobility ADL Comments: significant functional decline    OT Diagnosis: Generalized weakness;Disturbance of vision;Cognitive deficits;Hemiplegia dominant side;Altered mental status;Apraxia  OT Problem List: Decreased strength;Decreased range of motion;Impaired vision/perception;Impaired balance (sitting and/or standing);Decreased activity tolerance;Decreased coordination;Decreased cognition;Decreased safety awareness;Decreased knowledge of use of DME or AE;Decreased knowledge of precautions;Cardiopulmonary status limiting activity;Impaired sensation;Impaired tone;Impaired UE functional use;Increased edema OT Treatment  Interventions: Self-care/ADL training;Therapeutic exercise;Neuromuscular education;Energy conservation;DME  and/or AE instruction;Therapeutic activities;Cognitive remediation/compensation;Visual/perceptual remediation/compensation;Patient/family education;Balance training   OT Goals(Current goals can be found in the care plan section) Acute Rehab OT Goals Patient Stated Goal: to go home OT Goal Formulation: With family Time For Goal Achievement: 05/13/13 Potential to Achieve Goals: Good  Visit Information  Last OT Received On: 04/29/13 Assistance Needed: +2 History of Present Illness: 53 y.o. female admitted to [facility] on 04/22/13 with history of stroke that affected her RIGHT side per family member who are at bedside. Daughter states she had been talking to her on the phone between the hours of 9-10 AM and she was speaking normally. Her aunt found patient around 87 AM 04/21/2013 down at home, and per note she was confused, showed slurred speech and lever left sided neglect. Family at bedside state the left sided neglect is new. Patient was transferred to [facility] hospital from [facility] due to PLT count 22 and concern for TTP. TPE was initiated and patient was brought to [facility] hospital. Neurology was consulted concerning possibility of adding Plavix to TPE for embolic prevention.  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  Encephalomalacia involving the left frontal lobe, left parietal lobe, and left occipital lobe, related to old cortical strokes.        Prior Functioning     Home Living Family/patient expects to be discharged to:: Unsure Living Arrangements: Other relatives Additional Comments: Family not present to report and pt is a poor historian.   (ex-husband reports that pt lived with her sister and sister') Prior Function Level of Independence: Independent (states pt lived with sister and sister's husband and was  ind) Musician: Expressive difficulties;Receptive difficulties (see SLP note for more details. ) Dominant Hand: Left         Vision/Perception Vision - History Baseline Vision: Other (comment) Visual History:  (visual deficits from previous CVA) Patient Visual Report: Peripheral vision impairment Vision - Assessment Eye Alignment: Within Functional Limits Vision Assessment: Vision tested Ocular Range of Motion: Impaired-to be further tested in functional context Alignment/Gaze Preference: Gaze right Tracking/Visual Pursuits: Decreased smoothness of horizontal tracking;Decreased smoothness of vertical tracking Saccades: Decreased speed of saccadic movement;Additional head turns occurred during testing;Additional eye shifts occurred during testing Visual Fields: Left homonymous hemianopsia Additional Comments: poor visual attention Perception Perception: Impaired Inattention/Neglect: Does not attend to left visual field;Does not attend to left side of body Body Scheme: using therapists hand like it was her own Body Part Identification: unable to ID body parts Spatial Orientation: poor awareness of space Praxis Praxis: Impaired Praxis Impairment Details: Perseveration;Motor planning   Cognition  Cognition Arousal/Alertness: Awake/alert Behavior During Therapy: Agitated;Restless;Impulsive Overall Cognitive Status: Impaired/Different from baseline Area of Impairment: Attention;Following commands;Safety/judgement;Awareness;Orientation;Memory;Problem solving Orientation Level: Disoriented to;Time;Situation Current Attention Level: Focused Memory: Decreased recall of precautions;Decreased short-term memory Following Commands: Follows one step commands inconsistently Safety/Judgement: Decreased awareness of safety;Decreased awareness of deficits (pulling off tele stickers) Awareness: Intellectual (She seems to show no awareness of deficits at all) Problem  Solving: Slow processing;Decreased initiation;Difficulty sequencing;Requires verbal cues;Requires tactile cues General Comments: unable to copmlete purposeful actitivy without constant redirection. Pulling off leads, pulling at wires and tubes.     Extremity/Trunk Assessment Upper Extremity Assessment Upper Extremity Assessment: Defer to OT evaluation RUE Deficits / Details: poor spatial awreness with RUE LUE Deficits / Details: flexor synergy developing. Pt trying to use LUE but unaware that LUE is not working LUE Sensation: decreased light touch;decreased proprioception LUE Coordination: decreased fine  motor;decreased gross motor Lower Extremity Assessment Lower Extremity Assessment: RLE deficits/detail;LLE deficits/detail RLE Deficits / Details: right leg is actively moving and she can move it to command.  Enough functional strength to hold herself in supported standing on her right.  LLE Deficits / Details: difficult to assess due to left neglect.  I could not get her to preform any MMT to command on the left side.  I did not observe any active movement of the left leg and functionally it is hyper extended in stance and she is dragging it behind as we transferred to the right side into the recliner chair.  LLE:  (unable to fully assess due to cognitive deficits) LLE Sensation: decreased light touch LLE Coordination: decreased fine motor;decreased gross motor Cervical / Trunk Assessment Cervical / Trunk Assessment: Other exceptions Cervical / Trunk Exceptions: L bias     Mobility Bed Mobility Overal bed mobility: Needs Assistance Bed Mobility: Supine to Sit Supine to sit: Min assist General bed mobility comments: min assist more for safety as she is moving quickly and almost scooted too far off of the bed during transition to sitting EOB.   Transfers Overall transfer level: Needs assistance Equipment used: None Transfers: Sit to/from [employer] CorporationStand;Stand Pivot Transfers Sit to Stand: Mod  assist;+2 physical assistance Stand pivot transfers: +2 physical assistance;Mod assist General transfer comment: two person mod assist for safety and trunk support stood x 2 to preform pericare before transferring to the recliner chair on pt's right from the bed.  In standing, she leans to the left and needs support at her trunk for balance as well as her left knee blocked to prevent buckling.  In static standing, she can be a one person mod assist, but when going to transfer the second person is needed to help support her trunk to prevent a fall.  This would likely be worse if we were going to her left side.       Exercise     Balance Balance Overall balance assessment: Needs assistance Sitting-balance support: Feet supported;Single extremity supported Sitting balance-Leahy Scale: Fair Standing balance support: Single extremity supported Standing balance-Leahy Scale: Poor General Comments General comments (skin integrity, edema, etc.): restraints   End of Session OT - End of Session Activity Tolerance: Other (comment) (Limited by impulsivity and poor awareness RUE) Patient left: in chair;with call bell/phone within reach;with family/visitor present;with nursing/sitter in room Nurse Communication: Mobility status  GO     Jesslyn Viglione,HILLARY 04/29/2013, 2:47 PM [facility] Keaira Whitehurst, OTR/L  [phone] 04/29/2013

## 2013-04-29 NOTE — Consult Note (Signed)
Physical Medicine and Rehabilitation Consult  Reason for Consult: Left neglect, cognitive linguistic deficits with dysarthria, confusion Referring Physician: Dr. Pearlean Brownie.    HPI: Kaitlyn Valdez is a 53 y.o. female with history of CVA and baseline L side neglect found down at home, covered in emesis, confused with slurred speech and worsen L sided neglect. Workup revealed acute renal failure, elevated LDH and bilirubin as well as thrombocytopenia with platelets of 10.  Head CT revealed no acute findings but multiple prior infarcts. Transferred to [facility] on 04/22/13 for plasma phoresis. Follow up CCT with large nonhemorrhagic acute right middle cerebral artery distribution infarct involving portions of the right temporal lobe, right operculum region, right subinsular region, right frontal lobe, right parietal lobe and local mass effect with slight compression of the right lateral ventricle. Dr. Artis Delay was consulted for input and recommended proceeding with plasmapheresis due to recurrence of TTP. Neurology following for input and recommended avoiding antiplatelets given risk of bleeding. Strokes felt to be embolic with question of   Carotid dopplers without significant stenosis. 2 D echo with EF 60-65% and no wall abnormality. Follow up CCT on 04/26/13 with further evolution of stroke with mass effect and started on hypertonic solution to decrease cerebral edema. Patient with fever, confusion as well as severe left neglect. BC X2 and UCS negative. Platelets improved to 184 and last  PLEX 04/28/13. VVS consulted for placement of diatek catheter for continued outpatient PLEX.  Patient with significant cognitive linguistic deficits with dysarthria, severely decreased sustained attention, awareness, problem solving, reasoning, and safety as well as severe left sided neglect/inattention impacting overall function and safety       Review of Systems  Unable to perform ROS: mental acuity   Past  Medical History  Diagnosis Date  . CVA (cerebral vascular accident)   . Depression   . Hypertension   . Hypercholesteremia   . TTP (thrombotic thrombopenic purpura)    No past surgical history on file. Family History  Problem Relation Age of Onset  . Hypertension Mother   . Hyperlipidemia Mother   . Hypertension Father    Social History:  Lives with Hessie Diener?  She has no tobacco, alcohol, and drug history on file.   Allergies  Allergen Reactions  . Penicillins Anaphylaxis   Medications Prior to Admission  Medication Sig Dispense Refill  . acetaminophen (TYLENOL) 500 MG tablet Take 500 mg by mouth every 6 (six) hours as needed for headache.      . clonazePAM (KLONOPIN) 1 MG tablet Take 1 mg by mouth at bedtime.       . clopidogrel (PLAVIX) 75 MG tablet Take 75 mg by mouth daily with breakfast.      . desvenlafaxine (PRISTIQ) 50 MG 24 hr tablet Take 50 mg by mouth daily.      . fluticasone (FLONASE) 50 MCG/ACT nasal spray Place 2 sprays into both nostrils daily.      . folic acid (FOLVITE) 800 MCG tablet Take 400 mcg by mouth daily.      . pravastatin (PRAVACHOL) 40 MG tablet Take 40 mg by mouth daily.      . valsartan (DIOVAN) 40 MG tablet Take 40 mg by mouth daily.      . Vortioxetine HBr (BRINTELLIX) 10 MG TABS Take 20 mg by mouth daily.        Home: Home Living Family/patient expects to be discharged to:: Unsure Living Arrangements: Other relatives  Functional History:   Functional Status:  Mobility:          ADL:    Cognition: Cognition Overall Cognitive Status: Impaired/Different from baseline Arousal/Alertness: Awake/alert Orientation Level: Oriented to person;Disoriented to time (borderline awareness of place and situation) Attention: Sustained Sustained Attention: Impaired Sustained Attention Impairment: Verbal basic;Functional basic Memory: Impaired Memory Impairment: Decreased recall of new information;Decreased short term memory;Storage  deficit;Retrieval deficit Decreased Short Term Memory: Verbal basic;Functional basic Awareness: Impaired Awareness Impairment: Intellectual impairment Problem Solving: Impaired Problem Solving Impairment: Verbal basic;Functional basic Executive Function: Reasoning;Self Monitoring;Self Correcting Reasoning: Impaired Reasoning Impairment: Verbal basic;Functional basic Self Monitoring: Impaired Self Monitoring Impairment: Verbal basic;Functional basic Self Correcting: Impaired Self Correcting Impairment: Verbal basic;Functional basic Behaviors: Impulsive Safety/Judgment: Impaired Cognition Overall Cognitive Status: Impaired/Different from baseline  Blood pressure 150/60, pulse 85, temperature 98.9 F (37.2 C), temperature source Oral, resp. rate 18, height 5\' 5"  (1.651 m), weight 66.6 kg (146 lb 13.2 oz), SpO2 95.00%. Physical Exam  Nursing note and vitals reviewed. Constitutional: She appears well-developed and well-nourished.  Appears older than stated age. Has waist belt as well as mitten on right hand.   HENT:  Head: Normocephalic and atraumatic.  Eyes: Conjunctivae are normal. Pupils are equal, round, and reactive to light.  Neck: Normal range of motion. Neck supple.  Cardiovascular: Normal rate and regular rhythm.   No murmur heard. Respiratory: Effort normal and breath sounds normal. No respiratory distress. She has no wheezes.  GI: Soft. Bowel sounds are normal. She exhibits no distension. There is no tenderness.  Musculoskeletal: She exhibits no edema and no tenderness.  Neurological: She is alert.  Confused, rambling speech. Oriented to self only. Was able to state age as "38" with perseveration. Left hemiparesis with extensor tone LUE. left inattention but looks able to look to left with cues. May have Left HH but so distracted it's a little difficult to say. No active movement of LUE, moved LLE at 1+ to 2+. Decreased PP and LT left side.  Skin: Skin is warm and dry.     Results for orders placed during the hospital encounter of 04/22/13 (from the past 24 hour(s))  GLUCOSE, CAPILLARY     Status: None   Collection Time    04/28/13 12:20 PM      Result Value Range   Glucose-Capillary 94  70 - 99 mg/dL   Comment 1 Notify RN     Comment 2 Documented in Chart    CBC     Status: Abnormal   Collection Time    04/28/13  7:10 PM      Result Value Range   WBC 6.0  4.0 - 10.5 K/uL   RBC 2.52 (*) 3.87 - 5.11 MIL/uL   Hemoglobin 7.8 (*) 12.0 - 15.0 g/dL   HCT 16.1 (*) 09.6 - 04.5 %   MCV 95.6  78.0 - 100.0 fL   MCH 31.0  26.0 - 34.0 pg   MCHC 32.4  30.0 - 36.0 g/dL   RDW 40.9 (*) 81.1 - 91.4 %   Platelets 185  150 - 400 K/uL  CBC     Status: Abnormal   Collection Time    04/29/13  4:00 AM      Result Value Range   WBC 5.8  4.0 - 10.5 K/uL   RBC 2.56 (*) 3.87 - 5.11 MIL/uL   Hemoglobin 8.2 (*) 12.0 - 15.0 g/dL   HCT 78.2 (*) 95.6 - 21.3 %   MCV 94.9  78.0 - 100.0 fL   MCH 32.0  26.0 -  34.0 pg   MCHC 33.7  30.0 - 36.0 g/dL   RDW 16.115.3  09.611.5 - 04.515.5 %   Platelets 184  150 - 400 K/uL  LACTATE DEHYDROGENASE     Status: Abnormal   Collection Time    04/29/13  4:00 AM      Result Value Range   LDH 289 (*) 94 - 250 U/L  RENAL FUNCTION PANEL     Status: Abnormal   Collection Time    04/29/13  5:00 AM      Result Value Range   Sodium 147  137 - 147 mEq/L   Potassium 2.9 (*) 3.7 - 5.3 mEq/L   Chloride 104  96 - 112 mEq/L   CO2 28  19 - 32 mEq/L   Glucose, Bld 86  70 - 99 mg/dL   BUN 13  6 - 23 mg/dL   Creatinine, Ser 4.091.76 (*) 0.50 - 1.10 mg/dL   Calcium 8.5  8.4 - 81.110.5 mg/dL   Phosphorus 4.2  2.3 - 4.6 mg/dL   Albumin 3.4 (*) 3.5 - 5.2 g/dL   GFR calc non Af Amer 32 (*) >90 mL/min   GFR calc Af Amer 37 (*) >90 mL/min  MAGNESIUM     Status: Abnormal   Collection Time    04/29/13  5:00 AM      Result Value Range   Magnesium 1.2 (*) 1.5 - 2.5 mg/dL   No results found.  Assessment/Plan: Diagnosis: large right MCA infarct 1. Does the need for  close, 24 hr/day medical supervision in concert with the patient's rehab needs make it unreasonable for this patient to be served in a less intensive setting? Yes 2. Co-Morbidities requiring supervision/potential complications: depression, htn 3. Due to bladder management, bowel management, safety, skin/wound care, disease management, medication administration, pain management and patient education, does the patient require 24 hr/day rehab nursing? Yes 4. Does the patient require coordinated care of a physician, rehab nurse, PT (1-2 hrs/day, 5 days/week), OT (1-2 hrs/day, 5 days/week) and SLP (1-2 hrs/day, 5 days/week) to address physical and functional deficits in the context of the above medical diagnosis(es)? Yes Addressing deficits in the following areas: balance, endurance, locomotion, strength, transferring, bowel/bladder control, bathing, dressing, feeding, grooming, toileting, cognition, speech, language, swallowing and psychosocial support 5. Can the patient actively participate in an intensive therapy program of at least 3 hrs of therapy per day at least 5 days per week? Yes 6. The potential for patient to make measurable gains while on inpatient rehab is excellent 7. Anticipated functional outcomes upon discharge from inpatient rehab are min to mod assist with PT, min to mod assist with OT, min to mod assist with SLP. 8. Estimated rehab length of stay to reach the above functional goals is: 22-30 days 9. Does the patient have adequate social supports to accommodate these discharge functional goals? Potentially 10. Anticipated D/C setting: Home 11. Anticipated post D/C treatments: HH therapy 12. Overall Rehab/Functional Prognosis: good  RECOMMENDATIONS: This patient's condition is appropriate for continued rehabilitative care in the following setting: CIR Patient has agreed to participate in recommended program. n/a Note that insurance prior authorization may be required for reimbursement  for recommended care.  Comment: Pt will require significant assistance once home. It is unclear whether family can provide for these needs. Rehab Admissions Coordinator to follow up.  Thanks,  Ranelle OysterZachary T. Shania Bjelland, MD, [place] DomFAAPMR     04/29/2013

## 2013-04-29 NOTE — Progress Notes (Signed)
Speech Language Pathology Treatment: Dysphagia  Patient Details Name: Kaitlyn Valdez MRN: [id number] DOB: [birth year] Today's Date: 04/29/2013 Time: 1000-1015 SLP Time Calculation (min): 15 min  Assessment / Plan / Recommendation Clinical Impression  Treatment focused on diagnostic po trials to determine potential to advance diet. SLP provided trials of thin liquids and dysphagia 2/3 solids. Patient able to self feed with moderate-max cueing for small bites/sips given significant impulsivity.  Hard cough response noted in 1/10 trials of thin liquids via large straw sips. Cues for small, cup sips eliminated overt indication of aspiration. Overall, patient appearing to protect airway with solids as well with full oral clearance although inattention, decreased awareness, and left sided facial/oral weakness increases risk. Recommend diet advancement with close supervision for use of precautions.    HPI HPI: 53 yo with past medical history of left CVA found down at home, covered in emesis, confused with slurred speech and  Left  sided neglect.  Workup revealed thrombocytopenia.  Head CT revealed no acute findings but multiple prior infarcts. [facility]  [facility] large nonhemorrhagic acute R MCA infarct.       SLP Plan  Continue with current plan of care    Recommendations Diet recommendations: Dysphagia 2 (fine chop);Thin liquid Liquids provided via: Cup;No straw Medication Administration: Whole meds with puree Supervision: Patient able to self feed;Full supervision/cueing for compensatory strategies Compensations: Slow rate;Small sips/bites;Check for pocketing Postural Changes and/or Swallow Maneuvers: Seated upright 90 degrees              General recommendations: Rehab consult Oral Care Recommendations: Oral care BID Follow up Recommendations: Inpatient Rehab Plan: Continue with current plan of care    GO    [facility] Kaitlyn Furlough MA,  CCC-SLP [phone]  Kaitlyn Valdez 04/29/2013, 10:42 AM

## 2013-04-29 NOTE — Progress Notes (Signed)
[facility] Progress Note   Subjective:   S/p TPE x6.  Now on hold Pt eating well, good UOP SCr continues to improve Still with confusion     Objective:   BP 150/60  Pulse 85  Temp(Src) 98.9 F (37.2 C) (Oral)  Resp 18  Ht 5\' 5"  (1.651 m)  Wt 66.6 kg (146 lb 13.2 oz)  BMI 24.43 kg/m2  SpO2 95%  Intake/Output Summary (Last 24 hours) at 04/29/13 1322 Last data filed at 04/29/13 0533  Gross per 24 hour  Intake      0 ml  Output   1825 ml  Net  -1825 ml    Physical Exam: Gen:NAD, in chair. Left IJ temp cath (1/27) Cardio: RRR, no murmurs appreciated  Resp:CTAB,  JYN:WGNF/AO/ZHAbd:Soft/NT/ND, no CVA tendernes Ext: no edema B/L  Neuro:  Awake, hands in mittens, wants to gett OOB and put clothes on to go to church; "[employer]"  Left sided neglect very pronounced.  Dysarthric speech.    Labs: BMET  Recent Labs Lab 04/23/13 1541  04/24/13 0313  04/25/13 0440  04/26/13 0449 04/26/13 1219 04/27/13 0400 04/27/13 1342 04/28/13 0500 04/28/13 1028 04/29/13 0500  NA 144  < > 153*  155*  < > 152*  < > 154* 151* 151* 153* 152* 150* 147  K 3.5*  --  3.8  < > 3.0*  < > 3.4* 3.3* 4.0 3.4* 3.2* 3.0* 2.9*  CL 102  --  117*  < > 110  < > 110 105 110 103 108 102 104  CO2 29  --  25  --  29  --  30  --  26  --  30  --  28  GLUCOSE 91  --  107*  < > 114*  < > 109* 106* 117* 91 89 107* 86  BUN 30*  --  28*  < > 27*  < > 25* 23 21 22 17 16 13   CREATININE 2.67*  --  2.38*  < > 2.31*  < > 2.10* 2.00* 1.83* 1.90* 1.87* 1.90* 1.76*  CALCIUM 8.9  --  8.3*  --  8.2*  --  8.2*  --  8.5  --  8.7  --  8.5  PHOS 4.4  --  4.0  --  3.3  --  3.6  --  3.9  --  3.9  --  4.2  < > = values in this interval not displayed. CBC  Recent Labs Lab 04/22/13 2105  04/27/13 1815 04/28/13 0400 04/28/13 1028 04/28/13 1910 04/29/13 0400  WBC 6.4  < > 7.0 6.1  --  6.0 5.8  NEUTROABS 3.8  --   --   --   --   --   --   HGB 11.6*  < > 7.8* 8.4* 15.0 7.8* 8.2*  HCT 33.4*  < > 23.9* 25.8* 44.0 24.1*  24.3*  MCV 91.0  < > 94.8 95.9  --  95.6 94.9  PLT 10*  < > 131* 168  --  185 184  < > = values in this interval not displayed.  Results for [name], Carlethia T (MRN [id number]) as of 04/28/2013 09:09  Ref. Range 04/22/2013 21:05 04/24/2013 03:13 04/25/2013 04:40 04/26/2013 04:49 04/27/2013 04:00 04/28/2013 04:00  LDH Latest Range: 94-250 U/L 946 (H) 301 (H) 301 (H) 269 (H) 285 (H) 278 (H)    Results for [name], Salimata T (MRN [id number]) as of 04/28/2013 09:09  Ref. Range 04/22/2013 23:59  Adamts 13  Activity Latest Range: 68-163 % Activity 12 (L)    Medications:    . calcium gluconate IVPB  4 g Intravenous Once  . calcium gluconate  2 g Intravenous Once  . feeding supplement (ENSURE)  1 Container Oral TID BM  . pantoprazole  40 mg Oral Daily  . potassium chloride  40 mEq Oral Q4H  . 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  TTP: management per hematology. TPE is on hold after 6 treatments with improvement in LDH and stability of Plt count.    AKI:  2/2 TMA related to TTP.  Improving.    H/O of CVA's with new R MCA stroke, L sided neglect.   Neuro following and evaluating.     HTN Somewhat labile No meds at present.  Would start with CCB if necessary.    Will sign off for now.  If TPE indication redevelops inpt setting and hematology can't write TPE orders, we are happy to assist.     Sabra Heck, B,MD 04/29/2013 1:22 PM

## 2013-04-29 NOTE — Progress Notes (Signed)
[facility] PROGRESS NOTE  Kaitlyn Valdez MRN:[id number] DOB: 07/27/1960 DOA: 04/22/2013 PCP: [name],[name], MD  Assessment/Plan: Large Right MCA CVA -Presumed related to her TTP. -No antithrombotics for secondary stroke prevention given TTP. -Neuro following. -For CIR evaluation 2/2.  TTP -Currently receiving daily plasmapheresis. -Plt up to 184 today. -Plan to continue TPE until plt>140. -Discussed with heme today: no TPE today. They will call for placement of tunneled HD catheter. For OP TPE.  Hypernatremia -Was induced by neuro for cerebral edema. -Drifiting down slowly.  Code Status: Full Family Communication: Patient only  Disposition Plan: To be dtermined   Consultants:  Renal  Heme/onc   Antibiotics:  None   Subjective: Left neglect, seems agitated this am.  Objective: Filed Vitals:   04/28/13 1605 04/28/13 2000 04/29/13 0534 04/29/13 0612  BP: 143/84 155/89  150/60  Pulse: 81 77  85  Temp: 98.9 F (37.2 C) 100.1 F (37.8 C)  98.9 F (37.2 C)  TempSrc: Oral Oral  Oral  Resp: 18 20  18  Height:      Weight:   66.6 kg (146 lb 13.2 oz)   SpO2: 92% 94%  95%    Intake/Output Summary (Last 24 hours) at 04/29/13 1529 Last data filed at 04/29/13 1330  Gross per 24 hour  Intake      0 ml  Output   2325 ml  Net  -2325 ml   Filed Weights   04/28/13 0900 04/28/13 1113 04/29/13 0534  Weight: 66.906 kg (147 lb 8 oz) 66.906 kg (147 lb 8 oz) 66.6 kg (146 lb 13.2 oz)    Exam:   General:  awake  Cardiovascular: RRR  Respiratory: CTA B  Abdomen: S/ND/+BS  Extremities: trace bilateral edema   Data Reviewed: Basic Metabolic Panel:  Recent Labs Lab 04/22/13 2105  04/25/13 0440  04/26/13 0449  04/27/13 0400 04/27/13 1342 04/28/13 0500 04/28/13 1028 04/29/13 0500  NA 139  < > 152*  < > 154*  < > 151* 153* 152* 150* 147  K 4.6  < > 3.0*  < > 3.4*  < > 4.0 3.4* 3.2* 3.0* 2.9*  CL 101  < > 110  < > 110  < > 110 103 108 102 104   CO2 21  < > 29  --  30  --  26  --  30  --  28  GLUCOSE 107*  < > 114*  < > 109*  < > 117* 91 89 107* 86  BUN 26*  < > 27*  < > 25*  < > 21 22 17 16 13  CREATININE 2.49*  < > 2.31*  < > 2.10*  < > 1.83* 1.90* 1.87* 1.90* 1.76*  CALCIUM 9.7  < > 8.2*  --  8.2*  --  8.5  --  8.7  --  8.5  MG 2.0  --   --   --   --   --   --   --   --   --  1.2*  PHOS 5.4*  < > 3.3  --  3.6  --  3.9  --  3.9  --  4.2  < > = values in this interval not displayed. Liver Function Tests:  Recent Labs Lab 04/22/13 2105  04/25/13 0440 04/26/13 0449 04/27/13 0400 04/28/13 0500 04/29/13 0500  AST 54*  --  39*  --   --   --   --   ALT 28  --  21  --   --   --   --     ALKPHOS 70  --  65  --   --   --   --   BILITOT 1.9*  --  0.7  --   --   --   --   PROT 7.4  --  5.9*  --   --   --   --   ALBUMIN 4.0  < > 3.4* 3.2* 3.5 3.5 3.4*  < > = values in this interval not displayed. No results found for this basename: LIPASE, AMYLASE,  in the last 168 hours No results found for this basename: AMMONIA,  in the last 168 hours CBC:  Recent Labs Lab 04/22/13 2105  04/27/13 0400  04/27/13 1815 04/28/13 0400 04/28/13 1028 04/28/13 1910 04/29/13 0400  WBC 6.4  < > 5.7  --  7.0 6.1  --  6.0 5.8  NEUTROABS 3.8  --   --   --   --   --   --   --   --   HGB 11.6*  < > 9.0*  < > 7.8* 8.4* 15.0 7.8* 8.2*  HCT 33.4*  < > 26.9*  < > 23.9* 25.8* 44.0 24.1* 24.3*  MCV 91.0  < > 93.4  --  94.8 95.9  --  95.6 94.9  PLT 10*  < > 110*  --  131* 168  --  185 184  < > = values in this interval not displayed. Cardiac Enzymes:  Recent Labs Lab 04/25/13 1010 04/25/13 1610 04/25/13 2135  TROPONINI 0.34* <0.30 <0.30   BNP (last 3 results) No results found for this basename: PROBNP,  in the last 8760 hours CBG:  Recent Labs Lab 04/24/13 1148 04/24/13 1547 04/24/13 1914 04/24/13 2359 04/28/13 1220  GLUCAP 121* 180* 171* 106* 94    Recent Results (from the past 240 hour(s))  MRSA PCR SCREENING     Status: None    Collection Time    04/22/13  7:04 PM      Result Value Range Status   MRSA by PCR NEGATIVE  NEGATIVE Final   Comment:            The GeneXpert MRSA Assay (FDA     approved for NASAL specimens     only), is one component of a     comprehensive MRSA colonization     surveillance program. It is not     intended to diagnose MRSA     infection nor to guide or     monitor treatment for     MRSA infections.  CULTURE, BLOOD (ROUTINE X 2)     Status: None   Collection Time    04/22/13 10:10 PM      Result Value Range Status   Specimen Description BLOOD HEMODIALYSIS CATHETER   Final   Special Requests BOTTLES DRAWN AEROBIC AND ANAEROBIC 10CC EACH   Final   Culture  Setup Time     Final   Value: 04/23/2013 03:39     Performed at [facility]   Culture     Final   Value: NO GROWTH 5 DAYS     Performed at [facility]   Report Status 04/29/2013 FINAL   Final  CULTURE, BLOOD (ROUTINE X 2)     Status: None   Collection Time    04/22/13 10:18 PM      Result Value Range Status   Specimen Description BLOOD LEFT FOREARM   Final   Special Requests BOTTLES DRAWN AEROBIC ONLY 5CC   Final     Culture  Setup Time     Final   Value: 04/23/2013 03:03     Performed at [facility]   Culture     Final   Value: NO GROWTH 5 DAYS     Performed at [facility]   Report Status 04/29/2013 FINAL   Final  URINE CULTURE     Status: None   Collection Time    04/23/13 11:57 AM      Result Value Range Status   Specimen Description URINE, CATHETERIZED   Final   Special Requests NONE   Final   Culture  Setup Time     Final   Value: 04/23/2013 12:45     Performed at [facility]   Colony Count     Final   Value: NO GROWTH     Performed at [facility]   Culture     Final   Value: NO GROWTH     Performed at [facility]   Report Status 04/24/2013 FINAL   Final     Studies: No results found.  Scheduled Meds: . calcium gluconate IVPB  4 g  Intravenous Once  . calcium gluconate  2 g Intravenous Once  . feeding supplement (ENSURE)  1 Container Oral TID BM  . pantoprazole  40 mg Oral Daily  . potassium chloride  40 mEq Oral Q4H  . simvastatin  20 mg Oral q1800   Continuous Infusions: . sodium chloride    . citrate dextrose    . citrate dextrose      Principal Problem:   TTP (thrombotic thrombocytopenic purpura) Active Problems:   Acute encephalopathy   History of CVA (cerebrovascular accident)   Depression   Essential hypertension, benign   Arterial ischemic stroke, MCA (middle cerebral artery), right, acute    Time spent: 25 minutes. Greater than 50% of this time was spent in direct contact with the patient coordinating care.    HERNANDEZ ACOSTA,ESTELA  [facility] Pager [phone]  If 7PM-7AM, please contact night-coverage at [email or web address], password [email or web address] 04/29/2013, 3:29 PM  LOS: 7 days        

## 2013-04-30 ENCOUNTER — Encounter (HOSPITAL_COMMUNITY)
Admission: AD | Disposition: A | Discharge status: Skilled Nursing Facility | Payer: Self-pay | Source: Other Acute Inpatient Hospital | Attending: Internal Medicine

## 2013-04-30 ENCOUNTER — Encounter (HOSPITAL_COMMUNITY): Payer: Self-pay | Admitting: Gastroenterology

## 2013-04-30 DIAGNOSIS — I6330 Cerebral infarction due to thrombosis of unspecified cerebral artery: Secondary | ICD-10-CM

## 2013-04-30 DIAGNOSIS — I6789 Other cerebrovascular disease: Secondary | ICD-10-CM

## 2013-04-30 HISTORY — PX: TEE WITHOUT CARDIOVERSION: SHX5443

## 2013-04-30 LAB — RENAL FUNCTION PANEL
Albumin: 3.6 g/dL (ref 3.5–5.2)
BUN: 11 mg/dL (ref 6–23)
CHLORIDE: 106 meq/L (ref 96–112)
CO2: 21 mEq/L (ref 19–32)
Calcium: 8.1 mg/dL — ABNORMAL LOW (ref 8.4–10.5)
Creatinine, Ser: 1.54 mg/dL — ABNORMAL HIGH (ref 0.50–1.10)
GFR calc Af Amer: 43 mL/min — ABNORMAL LOW (ref >90)
GFR calc non Af Amer: 37 mL/min — ABNORMAL LOW (ref >90)
Glucose, Bld: 81 mg/dL (ref 70–99)
PHOSPHORUS: 2.8 mg/dL (ref 2.3–4.6)
POTASSIUM: 3.4 meq/L — AB (ref 3.7–5.3)
Sodium: 145 mEq/L (ref 137–147)

## 2013-04-30 LAB — CBC
HEMATOCRIT: 24.9 % — AB (ref 36.0–46.0)
HEMOGLOBIN: 8.2 g/dL — AB (ref 12.0–15.0)
MCH: 31.3 pg (ref 26.0–34.0)
MCHC: 32.9 g/dL (ref 30.0–36.0)
MCV: 95 fL (ref 78.0–100.0)
Platelets: 214 10*3/uL (ref 150–400)
RBC: 2.62 MIL/uL — ABNORMAL LOW (ref 3.87–5.11)
RDW: 15.9 % — ABNORMAL HIGH (ref 11.5–15.5)
WBC: 7.1 10*3/uL (ref 4.0–10.5)

## 2013-04-30 LAB — SAVE SMEAR

## 2013-04-30 LAB — LACTATE DEHYDROGENASE: LDH: 369 U/L — AB (ref 94–250)

## 2013-04-30 SURGERY — ECHOCARDIOGRAM, TRANSESOPHAGEAL
Anesthesia: Moderate Sedation

## 2013-04-30 MED ORDER — FENTANYL CITRATE 0.05 MG/ML IJ SOLN
INTRAMUSCULAR | Status: AC
Start: 2013-04-30 — End: 2013-04-30
  Filled 2013-04-30: qty 2

## 2013-04-30 MED ORDER — MIDAZOLAM HCL 5 MG/ML IJ SOLN
INTRAMUSCULAR | Status: AC
  Filled 2013-04-30: qty 2

## 2013-04-30 MED ORDER — SODIUM CHLORIDE 0.9 % IJ SOLN
10.0000 mL | INTRAMUSCULAR | Status: DC | PRN
  Administered 2013-04-30 – 2013-05-01 (×2): 10 mL

## 2013-04-30 MED ORDER — BUTAMBEN-TETRACAINE-BENZOCAINE 2-2-14 % EX AERO
INHALATION_SPRAY | CUTANEOUS | Status: DC | PRN
  Administered 2013-04-30: 2 via TOPICAL

## 2013-04-30 MED ORDER — FENTANYL CITRATE 0.05 MG/ML IJ SOLN
INTRAMUSCULAR | Status: DC | PRN
  Administered 2013-04-30: 25 ug via INTRAVENOUS

## 2013-04-30 MED ORDER — MIDAZOLAM HCL 10 MG/2ML IJ SOLN
INTRAMUSCULAR | Status: DC | PRN
  Administered 2013-04-30: 2 mg via INTRAVENOUS

## 2013-04-30 NOTE — Progress Notes (Signed)
  Echocardiogram Echocardiogram Transesophageal has been performed.  Amunique Neyra 04/30/2013, 12:40 PM

## 2013-04-30 NOTE — Progress Notes (Signed)
IP Rehab--Met with the patient and her daughter Anderson Malta and sister Lelon Frohlich at the bedside.  Sister Pamala Hurry on speaker phone hearing conversation.  Discussed post acute rehab options and family would like me to pursue IP rehab approval through [facility].  Please call if questions.    Gerlean Ren, [place]  Danne Baxter, [place]

## 2013-04-30 NOTE — Progress Notes (Signed)
Physical Therapy Treatment Patient Details Name: Kaitlyn Valdez MRN: [id number] DOB: 13-Feb-1961 Today's Date: 04/30/2013 Time: [id number] PT Time Calculation (min): 26 min  PT Assessment / Plan / Recommendation  History of Present Illness 53 y.o. female [facility] with history of stroke that affected her RIGHT side per family member who are at bedside. Daughter states she had been talking to her on the phone between the hours of 9-10 AM and she was speaking normally. Her aunt found patient around 7011 AM 04/21/2013 down at home, and per note she was confused, showed slurred speech and lever left sided neglect. Family at bedside state the left sided neglect is new. Patient [facility] due to PLT count 22 and concern for TTP. TPE was initiated and patient was [facility]. Neurology was consulted concerning possibility of adding Plavix to TPE for embolic prevention.  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  Encephalomalacia involving the left frontal lobe, left parietal lobe, and left occipital lobe, related to old cortical strokes.    PT Comments   Pt was able to take steps with hallway railing and two person assist for safety and management of pt (physical assist).  Pt did demonstrate some active movement of her left leg today during bed mobility, transfers and gait.  She continues to be an excellent rehab candidate.      Follow Up Recommendations  CIR (agree with OT re: will need 24/7 assist at home after CIR)     Does the patient have the potential to tolerate intense rehabilitation    Yes  Barriers to Discharge   None      Equipment Recommendations  Wheelchair (measurements PT);Wheelchair cushion (measurements PT)    Recommendations for Other Services   None  Frequency Min 4X/week   Progress towards PT Goals Progress towards PT goals: Progressing  toward goals  Plan Current plan remains appropriate    Precautions / Restrictions Precautions Precautions: Fall Precaution Comments: very impulsive, L neglect with significant awareness issues.  Left sided weakness.    Pertinent Vitals/Pain See vitals flow sheet.     Mobility  Bed Mobility Overal bed mobility: Needs Assistance Bed Mobility: Supine to Sit Supine to sit: Min assist General bed mobility comments: Min assist to support trunk for balance during transition, protect her left arm and prevent her from scooting too far forward off of the bed.  Pt did demonstrate active movement of her left leg during bed mobility (and transfers) today.   Transfers Overall transfer level: Needs assistance Equipment used: 2 person hand held assist Transfers: Sit to/from [employer]Stand;Stand Pivot Transfers Sit to Stand: +2 physical assistance;Mod assist Stand pivot transfers: +2 physical assistance;Mod assist General transfer comment: two person mod assist to transfer to the left today to the recliner chair.  The pt needed support at the trunk and blocking of left knee in stance, but she did attempt when turning to take steps with her left foot.   Ambulation/Gait Ambulation/Gait assistance: +2 physical assistance;Mod assist Ambulation Distance (Feet): 15 Feet Assistive device:  (right sided hallway railing, chair pulled up behind. ) Gait Pattern/deviations: Step-to pattern;Decreased step length - left;Decreased stance time - left;Decreased dorsiflexion - left;Decreased weight shift to left;Narrow base of support General Gait Details: With the use of the hallway railing for stability, the pt was able to take steps down the length of the rail with support at  her trunk for balance, manual assist to progress and then blocke her left leg in stance and after getting to the end of the railing the second person went back and got recliner chair to sit.   Modified Rankin (Stroke Patients Only) Pre-Morbid Rankin  Score:  (unknown ) Modified Rankin: Severe disability      PT Goals (current goals can now be found in the care plan section) Acute Rehab PT Goals Patient Stated Goal: to go home  Visit Information  Last PT Received On: 04/30/13 Assistance Needed: +2 History of Present Illness: 53 y.o. female admitted to [facility] on 04/22/13 with history of stroke that affected her RIGHT side per family member who are at bedside. Daughter states she had been talking to her on the phone between the hours of 9-10 AM and she was speaking normally. Her aunt found patient around 56 AM 04/21/2013 down at home, and per note she was confused, showed slurred speech and lever left sided neglect. Family at bedside state the left sided neglect is new. Patient was transferred to [facility] hospital from [facility] due to PLT count 22 and concern for TTP. TPE was initiated and patient was brought to [facility] hospital. Neurology was consulted concerning possibility of adding Plavix to TPE for embolic prevention.  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  Encephalomalacia involving the left frontal lobe, left parietal lobe, and left occipital lobe, related to old cortical strokes.     Subjective Data  Subjective: Pt talking about her boyfriend, borderline agitated due to so many tests.  "What now?" Patient Stated Goal: to go home   Cognition  Cognition Arousal/Alertness: Awake/alert Behavior During Therapy: Agitated;Restless;Impulsive Overall Cognitive Status: Impaired/Different from baseline Area of Impairment: Attention;Following commands;Safety/judgement;Awareness;Orientation;Memory;Problem solving Orientation Level: Disoriented to;Time;Situation Current Attention Level: Focused Memory: Decreased recall of precautions;Decreased short-term memory Following Commands: Follows one step commands inconsistently Safety/Judgement: Decreased awareness of  safety;Decreased awareness of deficits Awareness: Intellectual (she seems to show no awareness of deficits at all) Problem Solving: Slow processing;Decreased initiation;Difficulty sequencing;Requires verbal cues;Requires tactile cues General Comments: Pt needs constant redirection even in quiet room.  When in the hallway attempting gait, even more distracted requiring near constant redirection to task.      Balance  Balance Overall balance assessment: Needs assistance Sitting-balance support: Single extremity supported Sitting balance-Leahy Scale: Fair Sitting balance - Comments: any time pt tries to move in sitting she LOB posteriorly.  She can maintain static sitting with supervision and right arm supported.  Postural control: Posterior lean Standing balance support: Single extremity supported Standing balance-Leahy Scale: Poor  End of Session PT - End of Session Equipment Utilized During Treatment: Gait belt Activity Tolerance: Patient tolerated treatment well Patient left: in chair;with call bell/phone within reach;with chair alarm set    Jing Howatt B. Johnisha Louks, PT, DPT [phone]   04/30/2013, 3:36 PM

## 2013-04-30 NOTE — H&P (View-Only) (Signed)
[facility] PROGRESS NOTE  Kaitlyn Valdez [name] ZOX:[id number] DOB: [birth year] DOA: 04/22/2013 PCP: Isabella StallingNDIEGO,Kaitlyn M, MD  Assessment/Plan: Large Right MCA CVA -Presumed related to her TTP. -No antithrombotics for secondary stroke prevention given TTP. -Neuro following. -For CIR evaluation 2/2.  TTP -Currently receiving daily plasmapheresis. -Plt up to 184 today. -Plan to continue TPE until plt>140. -Discussed with heme today: no TPE today. They will call for placement of tunneled HD catheter. For OP TPE.  Hypernatremia -Was induced by neuro for cerebral edema. -Drifiting down slowly.  Code Status: Full Family Communication: Patient only  Disposition Plan: To be dtermined   Consultants:  Renal  Heme/onc   Antibiotics:  None   Subjective: Left neglect, seems agitated this am.  Objective: Filed Vitals:   04/28/13 1605 04/28/13 2000 04/29/13 0534 04/29/13 0612  BP: 143/84 155/89  150/60  Pulse: 81 77  85  Temp: 98.9 F (37.2 C) 100.1 F (37.8 C)  98.9 F (37.2 C)  TempSrc: Oral Oral  Oral  Resp: 18 20  18   Height:      Weight:   66.6 kg (146 lb 13.2 oz)   SpO2: 92% 94%  95%    Intake/Output Summary (Last 24 hours) at 04/29/13 1529 Last data filed at 04/29/13 1330  Gross per 24 hour  Intake      0 ml  Output   2325 ml  Net  -2325 ml   Filed Weights   04/28/13 0900 04/28/13 1113 04/29/13 0534  Weight: 66.906 kg (147 lb 8 oz) 66.906 kg (147 lb 8 oz) 66.6 kg (146 lb 13.2 oz)    Exam:   General:  awake  Cardiovascular: RRR  Respiratory: CTA B  Abdomen: S/ND/+BS  Extremities: trace bilateral edema   Data Reviewed: Basic Metabolic Panel:  Recent Labs Lab 04/22/13 2105  04/25/13 0440  04/26/13 0449  04/27/13 0400 04/27/13 1342 04/28/13 0500 04/28/13 1028 04/29/13 0500  NA 139  < > 152*  < > 154*  < > 151* 153* 152* 150* 147  K 4.6  < > 3.0*  < > 3.4*  < > 4.0 3.4* 3.2* 3.0* 2.9*  CL 101  < > 110  < > 110  < > 110 103 108 102 104   CO2 21  < > 29  --  30  --  26  --  30  --  28  GLUCOSE 107*  < > 114*  < > 109*  < > 117* 91 89 107* 86  BUN 26*  < > 27*  < > 25*  < > 21 22 17 16 13   CREATININE 2.49*  < > 2.31*  < > 2.10*  < > 1.83* 1.90* 1.87* 1.90* 1.76*  CALCIUM 9.7  < > 8.2*  --  8.2*  --  8.5  --  8.7  --  8.5  MG 2.0  --   --   --   --   --   --   --   --   --  1.2*  PHOS 5.4*  < > 3.3  --  3.6  --  3.9  --  3.9  --  4.2  < > = values in this interval not displayed. Liver Function Tests:  Recent Labs Lab 04/22/13 2105  04/25/13 0440 04/26/13 0449 04/27/13 0400 04/28/13 0500 04/29/13 0500  AST 54*  --  39*  --   --   --   --   ALT 28  --  21  --   --   --   --  ALKPHOS 70  --  65  --   --   --   --   BILITOT 1.9*  --  0.7  --   --   --   --   PROT 7.4  --  5.9*  --   --   --   --   ALBUMIN 4.0  < > 3.4* 3.2* 3.5 3.5 3.4*  < > = values in this interval not displayed. No results found for this basename: LIPASE, AMYLASE,  in the last 168 hours No results found for this basename: AMMONIA,  in the last 168 hours CBC:  Recent Labs Lab 04/22/13 2105  04/27/13 0400  04/27/13 1815 04/28/13 0400 04/28/13 1028 04/28/13 1910 04/29/13 0400  WBC 6.4  < > 5.7  --  7.0 6.1  --  6.0 5.8  NEUTROABS 3.8  --   --   --   --   --   --   --   --   HGB 11.6*  < > 9.0*  < > 7.8* 8.4* 15.0 7.8* 8.2*  HCT 33.4*  < > 26.9*  < > 23.9* 25.8* 44.0 24.1* 24.3*  MCV 91.0  < > 93.4  --  94.8 95.9  --  95.6 94.9  PLT 10*  < > 110*  --  131* 168  --  185 184  < > = values in this interval not displayed. Cardiac Enzymes:  Recent Labs Lab 04/25/13 1010 04/25/13 1610 04/25/13 2135  TROPONINI 0.34* <0.30 <0.30   BNP (last 3 results) No results found for this basename: PROBNP,  in the last 8760 hours CBG:  Recent Labs Lab 04/24/13 1148 04/24/13 1547 04/24/13 1914 04/24/13 2359 04/28/13 1220  GLUCAP 121* 180* 171* 106* 94    Recent Results (from the past 240 hour(s))  MRSA PCR SCREENING     Status: None    Collection Time    04/22/13  7:04 PM      Result Value Range Status   MRSA by PCR NEGATIVE  NEGATIVE Final   Comment:            The GeneXpert MRSA Assay (FDA     approved for NASAL specimens     only), is one component of a     comprehensive MRSA colonization     surveillance program. It is not     intended to diagnose MRSA     infection nor to guide or     monitor treatment for     MRSA infections.  CULTURE, BLOOD (ROUTINE X 2)     Status: None   Collection Time    04/22/13 10:10 PM      Result Value Range Status   Specimen Description BLOOD HEMODIALYSIS CATHETER   Final   Special Requests BOTTLES DRAWN AEROBIC AND ANAEROBIC 10CC EACH   Final   Culture  Setup Time     Final   Value: 04/23/2013 03:39     Performed at [facility]   Culture     Final   Value: NO GROWTH 5 DAYS     Performed at [facility]   Report Status 04/29/2013 FINAL   Final  CULTURE, BLOOD (ROUTINE X 2)     Status: None   Collection Time    04/22/13 10:18 PM      Result Value Range Status   Specimen Description BLOOD LEFT FOREARM   Final   Special Requests BOTTLES DRAWN AEROBIC ONLY 5CC   Final  Culture  Setup Time     Final   Value: 04/23/2013 03:03     Performed at [facility]   Culture     Final   Value: NO GROWTH 5 DAYS     Performed at [facility]   Report Status 04/29/2013 FINAL   Final  URINE CULTURE     Status: None   Collection Time    04/23/13 11:57 AM      Result Value Range Status   Specimen Description URINE, CATHETERIZED   Final   Special Requests NONE   Final   Culture  Setup Time     Final   Value: 04/23/2013 12:45     Performed at [facility] Count     Final   Value: NO GROWTH     Performed at [facility]   Culture     Final   Value: NO GROWTH     Performed at [facility]   Report Status 04/24/2013 FINAL   Final     Studies: No results found.  Scheduled Meds: . calcium gluconate IVPB  4 g  Intravenous Once  . calcium gluconate  2 g Intravenous Once  . feeding supplement (ENSURE)  1 Container Oral TID BM  . pantoprazole  40 mg Oral Daily  . potassium chloride  40 mEq Oral Q4H  . simvastatin  20 mg Oral q1800   Continuous Infusions: . sodium chloride    . citrate dextrose    . citrate dextrose      Principal Problem:   TTP (thrombotic thrombocytopenic purpura) Active Problems:   Acute encephalopathy   History of CVA (cerebrovascular accident)   Depression   Essential hypertension, benign   Arterial ischemic stroke, MCA (middle cerebral artery), right, acute    Time spent: 25 minutes. Greater than 50% of this time was spent in direct contact with the patient coordinating care.    Chaya Jan  [facility] Pager [phone]  If 7PM-7AM, please contact night-coverage at [email or web address], password [facility] 04/29/2013, 3:29 PM  LOS: 7 days

## 2013-04-30 NOTE — Progress Notes (Signed)
Speech Language Pathology Treatment: Dysphagia  Patient Details Name: Kaitlyn Valdez MRN: [id number] DOB: 03-Feb-1961 Today's Date: 04/30/2013 Time: 1545-1600 SLP Time Calculation (min): 15 min  Assessment / Plan / Recommendation Clinical Impression  Skilled treatment session focused on addressing dysphagia goals.  Patient had been working with PT and appeared to be awake following procedure from earlier in the day.  As a result, SLP proceeded with trials of Dys.1 textures and thin liquids.  Patient demonstrated cough x1 following consecutive cup sips.  SLP provided Max verbal, visual and tactile cues to ensure portion control and pacing which prevented any other s/s of aspiration.  Patient demonstrated oral holding and a delay in swallow initiation due to impaired sustained attention to self-feeding task.  SLP encouraged trials of Dys.2 however, patient refused further trials. SLP discussed refusal with RN and RN reported that she has been refusing meals.  Recommend to continue with close supervision during p.o.   HPI HPI: 53 yo with past medical history of left CVA found down at home, covered in emesis, confused with slurred speech and  Left  sided neglect.  Workup revealed thrombocytopenia.  Head CT revealed no acute findings but multiple prior infarcts. [facility]  [facility] nonhemorrhagic acute R MCA infarct.    Pertinent Vitals none  SLP Plan  Continue with current plan of care    Recommendations Diet recommendations: Dysphagia 2 (fine chop);Thin liquid Liquids provided via: Cup;No straw Medication Administration: Whole meds with puree Supervision: Patient able to self feed;Full supervision/cueing for compensatory strategies Compensations: Slow rate;Small sips/bites;Check for pocketing Postural Changes and/or Swallow Maneuvers: Seated upright 90 degrees;Out of bed for meals              Oral Care Recommendations: Oral care  BID Follow up Recommendations: Inpatient Rehab;24 hour supervision/assistance Plan: Continue with current plan of care    GO    Kaitlyn FerrettiMelissa Mollye Valdez, M.A., CCC-SLP [phone]  Dodd Schmid 04/30/2013, 5:04 PM

## 2013-04-30 NOTE — Progress Notes (Signed)
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 -No antithrombotics for secondary stroke prevention given TTP. -Neuro following. -Awaiting insurance approval for CIR. -TEE scheduled for today.  TTP -Currently receiving daily plasmapheresis. -Plt up to 214 today. -Discussed with heme 2/2:  They will call for placement of tunneled HD catheter. For OP TPE on a schedule that they will determine.  Hypernatremia -Was induced by neuro for cerebral edema. -Drifiting down slowly.  Hypokalemia -Repleted.  Code Status: Full Family Communication: Patient only  Disposition Plan: CIR vs SNF   Consultants:  Renal  Heme/onc   Antibiotics:  None   Subjective: Left neglect, tearful today.  Objective: Filed Vitals:   04/30/13 1250 04/30/13 1300 04/30/13 1310 04/30/13 1354  BP: 145/75 143/69 132/73 153/69  Pulse: 53 52  62  Temp:    99.1 F (37.3 C)  TempSrc:    Oral  Resp: 19 19  19   Height:      Weight:      SpO2: 98% 98%  96%    Intake/Output Summary (Last 24 hours) at 04/30/13 1449 Last data filed at 04/30/13 1430  Gross per 24 hour  Intake     61 ml  Output   1285 ml  Net  -1224 ml   Filed Weights   04/28/13 0900 04/28/13 1113 04/29/13 0534  Weight: 66.906 kg (147 lb 8 oz) 66.906 kg (147 lb 8 oz) 66.6 kg (146 lb 13.2 oz)    Exam:   General:  awake  Cardiovascular: RRR  Respiratory: CTA B  Abdomen: S/ND/+BS  Extremities: trace bilateral edema   Data Reviewed: Basic Metabolic Panel:  Recent Labs Lab 04/26/13 0449  04/27/13 0400 04/27/13 1342 04/28/13 0500 04/28/13 1028 04/29/13 0500 04/30/13 0440  NA 154*  < > 151* 153* 152* 150* 147 145  K 3.4*  < > 4.0 3.4* 3.2* 3.0* 2.9* 3.4*  CL 110  < > 110 103 108 102 104 106  CO2 30  --  26  --  30  --  28 21  GLUCOSE 109*  < > 117* 91 89 107* 86 81   BUN 25*  < > 21 22 17 16 13 11   CREATININE 2.10*  < > 1.83* 1.90* 1.87* 1.90* 1.76* 1.54*  CALCIUM 8.2*  --  8.5  --  8.7  --  8.5 8.1*  MG  --   --   --   --   --   --  1.2*  --   PHOS 3.6  --  3.9  --  3.9  --  4.2 2.8  < > = values in this interval not displayed. Liver Function Tests:  Recent Labs Lab 04/25/13 0440 04/26/13 0449 04/27/13 0400 04/28/13 0500 04/29/13 0500 04/30/13 0440  AST 39*  --   --   --   --   --   ALT 21  --   --   --   --   --   ALKPHOS 65  --   --   --   --   --   BILITOT 0.7  --   --   --   --   --   PROT 5.9*  --   --   --   --   --   ALBUMIN 3.4* 3.2* 3.5 3.5 3.4* 3.6   No results found for this basename: LIPASE, AMYLASE,  in the last 168 hours No results found for this basename: AMMONIA,  in the last 168 hours CBC:  Recent Labs Lab 04/28/13 0400 04/28/13 1028 04/28/13 1910 04/29/13 0400 04/29/13 1600 04/30/13 0440  WBC 6.1  --  6.0 5.8 5.9 7.1  HGB 8.4* 15.0 7.8* 8.2* 7.2* 8.2*  HCT 25.8* 44.0 24.1* 24.3* 21.7* 24.9*  MCV 95.9  --  95.6 94.9 94.3 95.0  PLT 168  --  185 184 176 214   Cardiac Enzymes:  Recent Labs Lab 04/25/13 1010 04/25/13 1610 04/25/13 2135  TROPONINI 0.34* <0.30 <0.30   BNP (last 3 results) No results found for this basename: PROBNP,  in the last 8760 hours CBG:  Recent Labs Lab 04/24/13 1148 04/24/13 1547 04/24/13 1914 04/24/13 2359 04/28/13 1220  GLUCAP 121* 180* 171* 106* 94    Recent Results (from the past 240 hour(s))  MRSA PCR SCREENING     Status: None   Collection Time    04/22/13  7:04 PM      Result Value Range Status   MRSA by PCR NEGATIVE  NEGATIVE Final   Comment:            The GeneXpert MRSA Assay (FDA     approved for NASAL specimens     only), is one component of a     comprehensive MRSA colonization     surveillance program. It is not     intended to diagnose MRSA     infection nor to guide or     monitor treatment for     MRSA infections.  CULTURE, BLOOD (ROUTINE X 2)      Status: None   Collection Time    04/22/13 10:10 PM      Result Value Range Status   Specimen Description BLOOD HEMODIALYSIS CATHETER   Final   Special Requests BOTTLES DRAWN AEROBIC AND ANAEROBIC 10CC EACH   Final   Culture  Setup Time     Final   Value: 04/23/2013 03:39     [facility]   Culture     Final   Value: NO GROWTH 5 DAYS     [facility]   Report Status 04/29/2013 FINAL   Final  CULTURE, BLOOD (ROUTINE X 2)     Status: None   Collection Time    04/22/13 10:18 PM      Result Value Range Status   Specimen Description BLOOD LEFT FOREARM   Final   Special Requests BOTTLES DRAWN AEROBIC ONLY 5CC   Final   Culture  Setup Time     Final   Value: 04/23/2013 03:03     [facility]formed [facility]   Culture     Final   Value: NO GROWTH 5 DAYS     [facility]   Report Status 04/29/2013 FINAL   Final  URINE CULTURE     Status: None   Collection Time    04/23/13 11:57 AM      Result Value Range Status   Specimen Description URINE, CATHETERIZED   Final   Special Requests NONE   Final   Culture  Setup Time     Final   Value: 04/23/2013 12:45     [facility]   Colony Count     Final   Value: NO GROWTH     [facility]formed at [facility]   Culture     Final  Value: NO GROWTH     Performed at [facility]   Report Status 04/24/2013 FINAL   Final     Studies: No results found.  Scheduled Meds: . feeding supplement (ENSURE)  1 Container Oral TID BM  . pantoprazole  40 mg Oral Daily  . simvastatin  20 mg Oral q1800   Continuous Infusions: . sodium chloride Stopped (04/30/13 1430)    Principal Problem:   TTP (thrombotic thrombocytopenic purpura) Active Problems:   Acute encephalopathy   History of CVA (cerebrovascular accident)   Depression   Essential hypertension, benign   Arterial ischemic stroke, MCA (middle cerebral artery), right, acute    Time spent:  25 minutes. Greater than 50% of this time was spent in direct contact with the patient coordinating care.    Chaya Jan  [facility] Pager [phone]  If 7PM-7AM, please contact night-coverage at [email or web address], password [facility] 04/30/2013, 2:49 PM  LOS: 8 days

## 2013-04-30 NOTE — Consult Note (Signed)
[facility]  [phone]) [phone]    HOSPITAL PROGRESS NOTE I have seen the patient, examined her, and agree with the documentation as outlined below. Events since 2/2 noted. No plasmapheresis performed since  04/28/13. Platelet count improved.. No bleeding issues. Denies pain. No respiratory or cardiac issues.Appears comfortable, she is interactive, Intermittent periods of confusion, but able to follow simple commands. Neurology involvement appreciated. For TEE this morning.   MEDICATIONS:  Scheduled Meds: . calcium gluconate IVPB  4 g Intravenous Once  . calcium gluconate  2 g Intravenous Once  . feeding supplement (ENSURE)  1 Container Oral TID BM  . pantoprazole  40 mg Oral Daily  . simvastatin  20 mg Oral q1800   Continuous Infusions: . sodium chloride    . citrate dextrose    . citrate dextrose     PRN Meds:.acetaminophen, acetaminophen, diphenhydrAMINE, diphenhydrAMINE, ondansetron (ZOFRAN) IV, RESOURCE THICKENUP CLEAR, sodium chloride  ALLERGIES:   Allergies  Allergen Reactions  . Penicillins Anaphylaxis     PHYSICAL EXAMINATION:   Filed Vitals:   04/30/13 0433  BP: 144/78  Pulse: 59  Temp: 99.1 F (37.3 C)  Resp: 18   Weight change:   GENERAL:alert, no distress and comfortable  SKIN: skin color, texture, turgor are normal, noted some petechiae rash, no ischemic changes  EYES: normal, conjunctiva are pink and non-injected, sclera clear  OROPHARYNX:no exudate, no erythema and lips, buccal mucosa, and tongue normal  NECK: supple, thyroid normal size, non-tender, without nodularity  LYMPH: no palpable lymphadenopathy in the cervical, axillary or inguinal  LUNGS: clear to auscultation and percussion with normal breathing effort  HEART: regular rate & rhythm and no murmurs and no lower extremity edema  ABDOMEN:abdomen soft, non-tender and normal bowel sounds  Musculoskeletal:no cyanosis of digits and no clubbing  PSYCH: alert , only oriented  in person with dysarthria  NEURO: Evidence of hemi neglect and left-sided weakness  LABORATORY/RADIOLOGY DATA:   Recent Labs Lab 04/28/13 0400 04/28/13 1028 04/28/13 1910 04/29/13 0400 04/29/13 1600 04/30/13 0440  WBC 6.1  --  6.0 5.8 5.9 7.1  HGB 8.4* 15.0 7.8* 8.2* 7.2* 8.2*  HCT 25.8* 44.0 24.1* 24.3* 21.7* 24.9*  PLT 168  --  185 184 176 214  MCV 95.9  --  95.6 94.9 94.3 95.0  MCH 31.2  --  31.0 32.0 31.3 31.3  MCHC 32.6  --  32.4 33.7 33.2 32.9  RDW 15.5  --  15.6* 15.3 15.4 15.9*    CMP    Recent Labs Lab 04/25/13 0440  04/26/13 0449  04/27/13 0400 04/27/13 1342 04/28/13 0500 04/28/13 1028 04/29/13 0500 04/30/13 0440  NA 152*  < > 154*  < > 151* 153* 152* 150* 147 145  K 3.0*  < > 3.4*  < > 4.0 3.4* 3.2* 3.0* 2.9* 3.4*  CL 110  < > 110  < > 110 103 108 102 104 106  CO2 29  --  30  --  26  --  30  --  28 21  GLUCOSE 114*  < > 109*  < > 117* 91 89 107* 86 81  BUN 27*  < > 25*  < > 21 22 17 16 13 11   CREATININE 2.31*  < > 2.10*  < > 1.83* 1.90* 1.87* 1.90* 1.76* 1.54*  CALCIUM 8.2*  --  8.2*  --  8.5  --  8.7  --  8.5 8.1*  MG  --   --   --   --   --   --   --   --  1.2*  --   AST 39*  --   --   --   --   --   --   --   --   --   ALT 21  --   --   --   --   --   --   --   --   --   ALKPHOS 65  --   --   --   --   --   --   --   --   --   BILITOT 0.7  --   --   --   --   --   --   --   --   --   < > = values in this interval not displayed.      Component Value Date/Time   BILITOT 0.7 04/25/2013 0440   BILIDIR <0.2 04/25/2013 0440   IBILI NOT CALCULATED 04/25/2013 0440     No results found for this basename: INR, PROTIME,  in the last 168 hours    Urinalysis    Component Value Date/Time   COLORURINE YELLOW 04/26/2013 1443   APPEARANCEUR CLEAR 04/26/2013 1443   LABSPEC 1.015 04/26/2013 1443   PHURINE 8.5* 04/26/2013 1443   GLUCOSEU NEGATIVE 04/26/2013 1443   HGBUR SMALL* 04/26/2013 1443   BILIRUBINUR NEGATIVE 04/26/2013 1443   KETONESUR NEGATIVE  04/26/2013 1443   PROTEINUR NEGATIVE 04/26/2013 1443   UROBILINOGEN 1.0 04/26/2013 1443   NITRITE NEGATIVE 04/26/2013 1443   LEUKOCYTESUR NEGATIVE 04/26/2013 1443    Drug  Liver Function Tests:  Recent Labs Lab 04/25/13 0440 04/26/13 0449 04/27/13 0400 04/28/13 0500 04/29/13 0500 04/30/13 0440  AST 39*  --   --   --   --   --   ALT 21  --   --   --   --   --   ALKPHOS 65  --   --   --   --   --   BILITOT 0.7  --   --   --   --   --   PROT 5.9*  --   --   --   --   --   ALBUMIN 3.4* 3.2* 3.5 3.5 3.4* 3.6    CBG:  Recent Labs Lab 04/24/13 1148 04/24/13 1547 04/24/13 1914 04/24/13 2359 04/28/13 1220  GLUCAP 121* 180* 171* 106* 94    Cardiac Enzymes:  Recent Labs Lab 04/25/13 1010 04/25/13 1610 04/25/13 2135  TROPONINI 0.34* <0.30 <0.30     Radiology Studies:  Ct Head Wo Contrast  04/26/2013   CLINICAL DATA:  Follow-up right middle cerebral artery distribution stroke.  EXAM: CT HEAD WITHOUT CONTRAST  TECHNIQUE: Contiguous axial images were obtained from the base of the skull through the vertex without intravenous contrast.  COMPARISON:  CT HEAD W/O CM dated 04/23/2013  FINDINGS: Further evolution of the large right middle cerebral artery distribution stroke, with involvement of the right temporal, frontal and parietal lobes. Minimal petechial hemorrhage at the anterior extent of the stroke in the posterior right frontal lobe consistent with rib bloods reperfusion. Mass effect with effacement of cortical sulci in the right cerebral hemisphere, slight compression of the right lateral ventricle, and slight shift of the midline to the left approximating 2-3 mm. No evidence of uncal or transtentorial herniation.  Encephalomalacia involving the left frontal lobe, left parietal lobe, and left occipital lobe, related to old cortical strokes. No evidence of hydrocephalus. No new intracranial abnormality elsewhere.  No  focal osseous abnormality involving the skull. Mucous retention  cyst or polyp in the left maxillary sinus and minimal mucosal thickening involving the left sphenoid sinus, unchanged. Remaining paranasal sinuses, bilateral mastoid air cells and bilateral middle ear cavities well aerated. Bilateral carotid siphon atherosclerosis.  IMPRESSION: 1. Further evolution of the large right middle cerebral artery distribution stroke, with minimal petechial hemorrhage at the anterior extent of the infarct in the posterior left frontal lobe as a result of luxury perfusion. No significant intracranial hemorrhage. 2. Mass effect with slight shift of the midline to the left approximating 2-3 mm. No evidence of transtentorial or uncal herniation. 3. No new intracranial abnormality elsewhere.   Electronically Signed   By: Hulan Saas M.D.   On: 04/26/2013 21:54   Ct Head Wo Contrast  04/23/2013   CLINICAL DATA:  53 year old female with hypertension and hypercholesterolemia down down with new left arm and leg weakness with slurred speech. TTP with platelets of 10.  EXAM: CT HEAD WITHOUT CONTRAST  TECHNIQUE: Contiguous axial images were obtained from the base of the skull through the vertex without intravenous contrast.  COMPARISON:  None.  FINDINGS: Exam is motion degraded.  Large nonhemorrhagic acute right middle cerebral artery distribution infarct involving portions of the right temporal lobe, right operculum region, right subinsular region, right frontal lobe and right parietal lobe. Local mass effect with slight compression of the right lateral ventricle without midline shift.  Remote infarct with encephalomalacia left posterior temporal -parietal lobe.  Baseline atrophy without hydrocephalus.  No intracranial mass lesion noted on this unenhanced exam.  Mild mucosal thickening paranasal sinuses.  IMPRESSION: Exam is motion degraded.  Large nonhemorrhagic acute right middle cerebral artery distribution infarct with slight compression of the right lateral ventricle without midline  shift. Patient is at risk for development of further mass effect/hemorrhage as the infarct evolves.  Remote infarct with encephalomalacia left posterior temporal -parietal lobe.  These results were called by telephone at the time of interpretation on 04/23/2013 at 3:16 PM to Dr. Amada Jupiter who verbally acknowledged these results.   Electronically Signed   By: Bridgett Larsson M.D.   On: 04/23/2013 15:19   Dg Chest Port 1 View  04/27/2013   CLINICAL DATA:  Atelectasis  EXAM: PORTABLE CHEST - 1 VIEW  COMPARISON:  04/24/2013  FINDINGS: Left IJ high flow catheter extends to the cavoatrial junction as before. Lungs clear. Heart size normal. . No effusion. Mild thoracolumbar levoscoliosis possibly positional.  IMPRESSION: No acute cardiopulmonary disease.   Electronically Signed   By: Oley Balm M.D.   On: 04/27/2013 08:24     ASSESSMENT AND PLAN: I have reviewed her peripheral smear. There is approximately 3 schistocytes per high power field, reduced from last week. It is adequate number of platelets. #1 TTP :   s/p Plasmapheresis, last on 04/28/13. Platelets today at 214. No plasmapheresis to be given today.  Appreciate VVS consult for placement of vascular cath for outpatient pheresis (SNF versus Rehab) . Cuff cath placement to take place following consent.    LDH today is 369, increased from 289 on 2/2. Overall, she still had persistent schistocytes in the peripheral blood. I am concerned that her LDH is getting worse. I will continue to follow her closely on a daily basis. I suspect she may need plasmapheresis soon  #2 anemia  Improved since prior. Today is 8.2 from 7.2 on the prior day. No active bleeding issues. Discussed with primary service, consider transfusion if hemoglobin <7. I will  order hepatic panel, reticulocyte count, and haptoglobin 11. If she started having evidence of hemolysis again, she will need resumption of plasmapheresis  #3 acute renal failure in the setting of TTP S/P   plasmapheresis as above, improving. Renal service signed off.  #4 history of stroke  Appreciate Neurology evaluation. For TEE today to rule out cardioembolic source.Per chart notes, if positive for PFO (patent foramen ovale), check bilateral lower extremity venous dopplers to rule out DVT as possible source of stroke. Appreciate speech pathology and PT  involvement.  Plavix Not recommended due to risk of TTP with Plavix. Recommend to start her on aspirin  #5 disposition/discharge planning I had the opportunity to meet with her sister and brother-in-law. Both of them have medical backgrounds. Her sister is willing to take her home once the patient completed rehabilitation.  Marcos Eke, PA-C 04/30/2013, 10:49 AM  Jakeem Grape, MD

## 2013-04-30 NOTE — CV Procedure (Signed)
Procedure: TEE  Indication: CVA  Sedation: Versed 2 mg IV, Fentanyl 25 mcg IV  Findings: Please see echo section for full report.  Normal LV size and systolic function, EF 60-65%.  Normal RV size and systolic function.  Mild LAE.  No LAA thrombus.  No PFO/ASD.  There was grade III plaque just adjacent to the take-off of the left subclavian in the arch/proximal descending thoracic aorta.   No definite source of embolus.   Kaitlyn Valdez 04/30/2013 12:37 PM

## 2013-04-30 NOTE — Interval H&P Note (Signed)
History and Physical Interval Note:  04/30/2013 12:22 PM  Kaitlyn Valdez  has presented today for surgery, with the diagnosis of STROKE  The various methods of treatment have been discussed with the patient and family. After consideration of risks, benefits and other options for treatment, the patient has consented to  Procedure(s): TRANSESOPHAGEAL ECHOCARDIOGRAM (TEE) (N/A) as a surgical intervention .  The patient's history has been reviewed, patient examined, no change in status, stable for surgery.  I have reviewed the patient's chart and labs.  Questions were answered to the patient's satisfaction.     Anthonio Mizzell [employer]

## 2013-04-30 NOTE — Progress Notes (Signed)
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 -No antithrombotics for secondary stroke prevention given TTP. -Neuro following. -For CIR evaluation 2/3. -TEE with no PFO/ASD, no LAA thrombus, no definite source of embolus.   TTP -Per heme: No plasmapheresis as platelets remain >140, at 214 today. Last plasmapheresis on 04/28/13.  -Appreciate VVS assistance with placement of vascular cath for outpatient pheresis.   Acute on chronic renal failure in the setting of TTP:  -Received plasma phoresis.  -Creatinine trending down.  -Nephrology has signed off.   Hypernatremia -Resolved, sodium trended down slowly, at 145 today.  -Was induced by neuro for cerebral edema.   Code Status: Full Family Communication: Patient only  Disposition Plan: To be determined, CIR eval today   Consultants:  Renal  Heme/onc  Neurology  CIR Antibiotics:  None   Subjective: Left neglect, seems tired after TEE. States that she is thirsty and hungry.   Objective: Filed Vitals:   04/30/13 1250 04/30/13 1300 04/30/13 1310 04/30/13 1354  BP: 145/75 143/69 132/73 153/69  Pulse: 53 52  62  Temp:    99.1 F (37.3 C)  TempSrc:    Oral  Resp: 19 19  19   Height:      Weight:      SpO2: 98% 98%  96%    Intake/Output Summary (Last 24 hours) at 04/30/13 1437 Last data filed at 04/30/13 1300  Gross per 24 hour  Intake     31 ml  Output   1285 ml  Net  -1254 ml   Filed Weights   04/28/13 0900 04/28/13 1113 04/29/13 0534  Weight: 147 lb 8 oz (66.906 kg) 147 lb 8 oz (66.906 kg) 146 lb 13.2 oz (66.6 kg)    Exam:   General:  Awake, resting in bed, in NAD, left neglect  Cardiovascular: RRR  Respiratory: CTA B, no respiratory distress.   Abdomen: S/ND/+BS  Extremities: trace bilateral edema   Data Reviewed: Basic Metabolic Panel:  Recent  Labs Lab 04/26/13 0449  04/27/13 0400 04/27/13 1342 04/28/13 0500 04/28/13 1028 04/29/13 0500 04/30/13 0440  NA 154*  < > 151* 153* 152* 150* 147 145  K 3.4*  < > 4.0 3.4* 3.2* 3.0* 2.9* 3.4*  CL 110  < > 110 103 108 102 104 106  CO2 30  --  26  --  30  --  28 21  GLUCOSE 109*  < > 117* 91 89 107* 86 81  BUN 25*  < > 21 22 17 16 13 11   CREATININE 2.10*  < > 1.83* 1.90* 1.87* 1.90* 1.76* 1.54*  CALCIUM 8.2*  --  8.5  --  8.7  --  8.5 8.1*  MG  --   --   --   --   --   --  1.2*  --   PHOS 3.6  --  3.9  --  3.9  --  4.2 2.8  < > = values in this interval not displayed. Liver Function Tests:  Recent Labs Lab 04/25/13 0440 04/26/13 0449 04/27/13 0400 04/28/13 0500 04/29/13 0500 04/30/13 0440  AST 39*  --   --   --   --   --   ALT 21  --   --   --   --   --   ALKPHOS 65  --   --   --   --   --  BILITOT 0.7  --   --   --   --   --   PROT 5.9*  --   --   --   --   --   ALBUMIN 3.4* 3.2* 3.5 3.5 3.4* 3.6   No results found for this basename: LIPASE, AMYLASE,  in the last 168 hours No results found for this basename: AMMONIA,  in the last 168 hours CBC:  Recent Labs Lab 04/28/13 0400 04/28/13 1028 04/28/13 1910 04/29/13 0400 04/29/13 1600 04/30/13 0440  WBC 6.1  --  6.0 5.8 5.9 7.1  HGB 8.4* 15.0 7.8* 8.2* 7.2* 8.2*  HCT 25.8* 44.0 24.1* 24.3* 21.7* 24.9*  MCV 95.9  --  95.6 94.9 94.3 95.0  PLT 168  --  185 184 176 214   Cardiac Enzymes:  Recent Labs Lab 04/25/13 1010 04/25/13 1610 04/25/13 2135  TROPONINI 0.34* <0.30 <0.30   BNP (last 3 results) No results found for this basename: PROBNP,  in the last 8760 hours CBG:  Recent Labs Lab 04/24/13 1148 04/24/13 1547 04/24/13 1914 04/24/13 2359 04/28/13 1220  GLUCAP 121* 180* 171* 106* 94    Recent Results (from the past 240 hour(s))  MRSA PCR SCREENING     Status: None   Collection Time    04/22/13  7:04 PM      Result Value Range Status   MRSA by PCR NEGATIVE  NEGATIVE Final   Comment:             The GeneXpert MRSA Assay (FDA     approved for NASAL specimens     only), is one component of a     comprehensive MRSA colonization     surveillance program. It is not     intended to diagnose MRSA     infection nor to guide or     monitor treatment for     MRSA infections.  CULTURE, BLOOD (ROUTINE X 2)     Status: None   Collection Time    04/22/13 10:10 PM      Result Value Range Status   Specimen Description BLOOD HEMODIALYSIS CATHETER   Final   Special Requests BOTTLES DRAWN AEROBIC AND ANAEROBIC 10CC EACH   Final   Culture  Setup Time     Final   Value: 04/23/2013 03:39     Performed at [facility]   Culture     Final   Value: NO GROWTH 5 DAYS     Performed at [facility]   Report Status 04/29/2013 FINAL   Final  CULTURE, BLOOD (ROUTINE X 2)     Status: None   Collection Time    04/22/13 10:18 PM      Result Value Range Status   Specimen Description BLOOD LEFT FOREARM   Final   Special Requests BOTTLES DRAWN AEROBIC ONLY 5CC   Final   Culture  Setup Time     Final   Value: 04/23/2013 03:03     Performed at [facility]   Culture     Final   Value: NO GROWTH 5 DAYS     Performed at [facility]   Report Status 04/29/2013 FINAL   Final  URINE CULTURE     Status: None   Collection Time    04/23/13 11:57 AM      Result Value Range Status   Specimen Description URINE, CATHETERIZED   Final   Special Requests NONE   Final   Culture  Setup Time     Final   Value: 04/23/2013 12:45     Performed at [facility] Count     Final   Value: NO GROWTH     Performed at [facility]   Culture     Final   Value: NO GROWTH     Performed at [facility]   Report Status 04/24/2013 FINAL   Final      Scheduled Meds: . calcium gluconate IVPB  4 g Intravenous Once  . calcium gluconate  2 g Intravenous Once  . feeding supplement (ENSURE)  1 Container Oral TID BM  . pantoprazole  40 mg Oral Daily  . simvastatin   20 mg Oral q1800   Continuous Infusions: . sodium chloride 500 mL (04/30/13 1127)  . citrate dextrose    . citrate dextrose      Principal Problem:   TTP (thrombotic thrombocytopenic purpura) Active Problems:   Acute encephalopathy   History of CVA (cerebrovascular accident)   Depression   Essential hypertension, benign   Arterial ischemic stroke, MCA (middle cerebral artery), right, acute    Time spent: 25 minutes. Greater than 50% of this time was spent in direct contact with the patient coordinating care.    Ky Barban, MD PGY-II [facility] Internal Medicine Program      Patient also seen and examined by Dr. Michel Santee [facility] Pager [phone]  If 7PM-7AM, please contact night-coverage at [email or web address], password [facility] 04/30/2013, 2:37 PM  LOS: 8 days       Agree with above note.  Peggye Pitt, MD [facility] Pager: [phone]

## 2013-04-30 NOTE — Progress Notes (Signed)
Stroke Team Progress Note  HISTORY Kaitlyn Valdez is an 53 y.o. female with history of stroke that affected her RIGHT side per family member who are at bedside. Daughter states she had been talking to her on the phone between the hours of 9-10 AM and she was speaking normally. Her aunt found patient around 38 AM 04/21/2013 down at home, and per note she was confused, showed slurred speech and lever left sided neglect. Family at bedside state the left sided neglect is new. Patient was transferred to [facility] hospital from [facility] due to PLT count 22 and concern for TTP. TPE was initiated and patient was brought to [facility] hospital. Neurology was consulted concerning possibility of adding Plavix to TPE for embolic prevention.  Patient was not administerd TPA secondary to PLT count of 10. She was admitted to the ICU for further evaluation and treatment.  SUBJECTIVE Family at bedside along with rehab admission coordinator. Planned catheter insertion today. Patient up in bed, talking.  OBJECTIVE Most recent Vital Signs: Filed Vitals:   04/29/13 0612 04/29/13 1400 04/29/13 2109 04/30/13 0433  BP: 150/60 143/79 152/86 144/78  Pulse: 85 62 71 59  Temp: 98.9 F (37.2 C) 98.9 F (37.2 C) 98 F (36.7 C) 99.1 F (37.3 C)  TempSrc: Oral Oral Oral Oral  Resp: 18 18 18 18   Height:      Weight:      SpO2: 95% 95% 95% 98%   CBG (last 3)   Recent Labs  04/28/13 1220  GLUCAP 94    IV Fluid Intake:   . sodium chloride    . citrate dextrose    . citrate dextrose      MEDICATIONS  . calcium gluconate IVPB  4 g Intravenous Once  . calcium gluconate  2 g Intravenous Once  . feeding supplement (ENSURE)  1 Container Oral TID BM  . pantoprazole  40 mg Oral Daily  . simvastatin  20 mg Oral q1800   PRN:  acetaminophen, acetaminophen, diphenhydrAMINE, diphenhydrAMINE, ondansetron (ZOFRAN) IV, RESOURCE THICKENUP CLEAR, sodium chloride  Diet:  NPO  Activity:  OOB DVT Prophylaxis:  SCDs   CLINICALLY  SIGNIFICANT STUDIES Basic Metabolic Panel:   Recent Labs Lab 04/29/13 0500 04/30/13 0440  NA 147 145  K 2.9* 3.4*  CL 104 106  CO2 28 21  GLUCOSE 86 81  BUN 13 11  CREATININE 1.76* 1.54*  CALCIUM 8.5 8.1*  MG 1.2*  --   PHOS 4.2 2.8   Liver Function Tests:  Recent Labs Lab 04/25/13 0440  04/29/13 0500 04/30/13 0440  AST 39*  --   --   --   ALT 21  --   --   --   ALKPHOS 65  --   --   --   BILITOT 0.7  --   --   --   PROT 5.9*  --   --   --   ALBUMIN 3.4*  < > 3.4* 3.6  < > = values in this interval not displayed. CBC:   Recent Labs Lab 04/29/13 1600 04/30/13 0440  WBC 5.9 7.1  HGB 7.2* 8.2*  HCT 21.7* 24.9*  MCV 94.3 95.0  PLT 176 214   Coagulation:  No results found for this basename: LABPROT, INR,  in the last 168 hours Cardiac Enzymes:   Recent Labs Lab 04/25/13 1010 04/25/13 1610 04/25/13 2135  TROPONINI 0.34* <0.30 <0.30   Urinalysis:   Recent Labs Lab 04/26/13 1057 04/26/13 1443  COLORURINE YELLOW  YELLOW  LABSPEC 1.017 1.015  PHURINE 8.5* 8.5*  GLUCOSEU NEGATIVE NEGATIVE  HGBUR SMALL* SMALL*  BILIRUBINUR NEGATIVE NEGATIVE  KETONESUR NEGATIVE NEGATIVE  PROTEINUR 30* NEGATIVE  UROBILINOGEN 1.0 1.0  NITRITE NEGATIVE NEGATIVE  LEUKOCYTESUR NEGATIVE NEGATIVE   Lipid Panel    Component Value Date/Time   CHOL 122 04/24/2013 0313   TRIG 99 04/24/2013 0313   HDL 37* 04/24/2013 0313   CHOLHDL 3.3 04/24/2013 0313   VLDL 20 04/24/2013 0313   LDLCALC 65 04/24/2013 0313   HgbA1C  Lab Results  Component Value Date   HGBA1C 4.8 04/23/2013    Urine Drug Screen:   No results found for this basename: labopia,  cocainscrnur,  labbenz,  amphetmu,  thcu,  labbarb    Alcohol Level: No results found for this basename: ETH,  in the last 168 hours  No results found.  CT of the brain  04/26/2013  1. Further evolution of the large right middle cerebral artery distribution stroke, with minimal petechial hemorrhage at the anterior extent of the infarct  in the posterior left frontal lobe as a result of luxury perfusion. No significant intracranial hemorrhage. 2. Mass effect with slight shift of the midline to the left approximating 2-3 mm. No evidence of transtentorial or uncal herniation. 3. No new intracranial abnormality elsewhere. 04/23/2013 Exam is motion degraded. Large nonhemorrhagic acute right middle cerebral artery distribution infarct with slight compression of the right lateral ventricle without midline shift. Patient is at risk for development of further mass effect/hemorrhage as the infarct evolves. Remote infarct with encephalomalacia left posterior temporal -parietal lobe.   2D Echocardiogram  EF 60-65% with no source of embolus.   Carotid Doppler  Bilateral: 1-39% ICA stenosis. Vertebral artery flow is antegrade.   TCD This was a abnormal transcranial Doppler study, with low right middle cerebral artery mean flow velocities suggesting distal stenosis and poor left temporal and occipital windows limiting evaluation..   TEE  CXR   04/27/2013 No acute cardiopulmonary disease.  04/24/2013 1. Slightly increased pulmonary vascular congestion without evidence of pulmonary edema. 2. Stable position of left IJ hemodialysis catheter.  04/22/2013 Left IJ dialysis catheter tip mid SVC level No acute finding   EKG  Sinus bradycardia with short PR. Nonspecific ST abnormality  Therapy Recommendations  CIR  Physical Exam   Elderly caucasian lady not in distress. Afebrile. Head is nontraumatic. Neck is supple without bruit. Cardiac exam no murmur or gallop. Lungs are clear to auscultation. Distal pulses are well felt.  Neurological Exam : Awake alert. Right gaze preference but can look up to the left till midline. Does not blink to threat on the left but does so on the right. Pupils 4 mm irregular but sluggishly reactive. Fundi were not visualized. Speech is dysarthric and she is disoriented and confused and distractible. Follows simple one-step  commands only. Tongue is midline. Left lower facial weakness. Left hemiparesis but will withdraw left upper and lower extremity to painful stimuli. Purposeful movements and right side against gravity. Left plantar is upgoing right is downgoing. Diminished sensation on the left and significant left hemineglect. Gait was not tested.   ASSESSMENT Kaitlyn Valdez is a 53 y.o. female presenting with confusion, slurred speech, and left sided neglect. Imaging confirms a right wedge shaped MCA infarct. Infarct felt to be embolic given 3 prior infarcts in the left brain There is no documented large vessel disease per old records. New large vessel infarcts unlikely to be associated with TTP which typically  causes microangiopathy, especially no associated with recurrent strokes. Most likely differentials include cardioembolic source, less likely primary hypercoagulable source. On clopidogrel 75 mg orally every day prior to admission. Now on no antithrombotics for secondary stroke prevention. Patient with resultant confusion (improved), dysarthria, neurologic neglect, dysphagia (improved), left arm hemiparesis and field cut. Work up underway.  Reviewed old records from Sept 12, 2011,  neurologist Karren CobbleMustafa Hammad, [facility] Had cerebral angio at that time which showed no large vessel occlusion or stenosis. MRI showed L subacute occipital infarct in setting of old infarcts L frontal, L occipital, R parietal with volume loss. Sept 2011-April 2012 PLT range 102-197 at that time.  Hx congenital heart disease with surgical repair.   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), undergoing plasmaphoresis, Platelets improved 10 to 168  Severe anemia, Stable Hb at 8.4   Acute Renal failure, Cr 1.76  Induced hypernatremia in order to decrease cerebral edema, protocol stopped 04/24/2013, Na drifting down,  Na 150 this am  Hypertension   Hyperlipidemia, LDL 65, on pravachol 40 mg  daily PTA, now on no statin as NPO, goal LDL < 100 (< 70 for diabetics)   Hx strokes - baseline L HP per family suggests old right brain stroke; old left brain strokes seen on imaging   Depression  Hx hepatitis C  Hospital day # 8  TREATMENT/PLAN  No antithrombotics at this time for secondary stroke prevention due to TTP  TEE to look for embolic source. Arranged with [facility] for today.  If positive for PFO (patent foramen ovale), check bilateral lower extremity venous dopplers to rule out DVT as possible source of stroke. Will discuss possibility of a loop recorder with cardiologist if TEE unrevealing. Consider hypercoagulable labs in the future if no stroke source found Rehab following for disposition.  Annie MainSHARON BIBY, MSN, RN, ANVP-BC, ANP-BC, Lawernce IonGNP-BC [facility] Pager: [phone] 04/30/2013 10:10 AM  I have personally obtained a history, examined the patient, evaluated imaging results, and formulated the assessment and plan of care. I agree with the above.  Delia HeadyPramod Jhonathan Desroches, MD

## 2013-05-01 ENCOUNTER — Encounter (HOSPITAL_COMMUNITY): Payer: Medicare HMO | Admitting: Anesthesiology

## 2013-05-01 ENCOUNTER — Encounter (HOSPITAL_COMMUNITY)
Admission: AD | Disposition: A | Discharge status: Skilled Nursing Facility | Payer: Self-pay | Source: Other Acute Inpatient Hospital | Attending: Internal Medicine

## 2013-05-01 ENCOUNTER — Other Ambulatory Visit: Payer: Self-pay | Admitting: Hematology and Oncology

## 2013-05-01 ENCOUNTER — Telehealth: Payer: Self-pay | Admitting: Hematology and Oncology

## 2013-05-01 ENCOUNTER — Inpatient Hospital Stay (HOSPITAL_COMMUNITY): Payer: Medicare HMO | Admitting: Anesthesiology

## 2013-05-01 ENCOUNTER — Encounter (HOSPITAL_COMMUNITY): Payer: Self-pay | Admitting: Cardiology

## 2013-05-01 ENCOUNTER — Inpatient Hospital Stay (HOSPITAL_COMMUNITY): Payer: Medicare HMO

## 2013-05-01 DIAGNOSIS — M3119 Other thrombotic microangiopathy: Secondary | ICD-10-CM

## 2013-05-01 DIAGNOSIS — M311 Thrombotic microangiopathy: Secondary | ICD-10-CM

## 2013-05-01 DIAGNOSIS — D696 Thrombocytopenia, unspecified: Secondary | ICD-10-CM

## 2013-05-01 HISTORY — PX: INSERTION OF DIALYSIS CATHETER: SHX1324

## 2013-05-01 LAB — RENAL FUNCTION PANEL
Albumin: 3.4 g/dL — ABNORMAL LOW (ref 3.5–5.2)
BUN: 12 mg/dL (ref 6–23)
CHLORIDE: 108 meq/L (ref 96–112)
CO2: 21 mEq/L (ref 19–32)
Calcium: 8.2 mg/dL — ABNORMAL LOW (ref 8.4–10.5)
Creatinine, Ser: 1.52 mg/dL — ABNORMAL HIGH (ref 0.50–1.10)
GFR calc non Af Amer: 38 mL/min — ABNORMAL LOW (ref >90)
GFR, EST AFRICAN AMERICAN: 44 mL/min — AB (ref >90)
GLUCOSE: 78 mg/dL (ref 70–99)
PHOSPHORUS: 3.3 mg/dL (ref 2.3–4.6)
Potassium: 3.6 mEq/L — ABNORMAL LOW (ref 3.7–5.3)
Sodium: 144 mEq/L (ref 137–147)

## 2013-05-01 LAB — HEPATIC FUNCTION PANEL
ALBUMIN: 3.4 g/dL — AB (ref 3.5–5.2)
ALK PHOS: 65 U/L (ref 39–117)
ALT: 22 U/L (ref 0–35)
AST: 28 U/L (ref 0–37)
Total Bilirubin: 0.5 mg/dL (ref 0.3–1.2)
Total Protein: 6.5 g/dL (ref 6.0–8.3)

## 2013-05-01 LAB — CBC
HEMATOCRIT: 25.9 % — AB (ref 36.0–46.0)
HEMOGLOBIN: 8.6 g/dL — AB (ref 12.0–15.0)
MCH: 31.4 pg (ref 26.0–34.0)
MCHC: 33.2 g/dL (ref 30.0–36.0)
MCV: 94.5 fL (ref 78.0–100.0)
Platelets: 235 10*3/uL (ref 150–400)
RBC: 2.74 MIL/uL — AB (ref 3.87–5.11)
RDW: 16.1 % — AB (ref 11.5–15.5)
WBC: 7.6 10*3/uL (ref 4.0–10.5)

## 2013-05-01 LAB — HAPTOGLOBIN: Haptoglobin: 120 mg/dL (ref 45–215)

## 2013-05-01 LAB — LACTATE DEHYDROGENASE: LDH: 379 U/L — ABNORMAL HIGH (ref 94–250)

## 2013-05-01 SURGERY — INSERTION OF DIALYSIS CATHETER
Anesthesia: Monitor Anesthesia Care | Site: Neck

## 2013-05-01 MED ORDER — CLOPIDOGREL BISULFATE 75 MG PO TABS: 75.0000 mg | ORAL_TABLET | Freq: Every day | ORAL | Status: DC

## 2013-05-01 MED ORDER — HEPARIN SODIUM (PORCINE) 5000 UNIT/ML IJ SOLN
INTRAMUSCULAR | Status: DC | PRN
  Administered 2013-05-01: 10:00:00

## 2013-05-01 MED ORDER — FOLIC ACID 0.5 MG HALF TAB: 0.5000 mg | ORAL_TABLET | Freq: Every day | ORAL | Status: AC

## 2013-05-01 MED ORDER — FENTANYL CITRATE 0.05 MG/ML IJ SOLN
INTRAMUSCULAR | Status: AC
  Filled 2013-05-01: qty 5

## 2013-05-01 MED ORDER — PANTOPRAZOLE SODIUM 40 MG PO TBEC: 40.0000 mg | DELAYED_RELEASE_TABLET | Freq: Every day | ORAL | Status: AC

## 2013-05-01 MED ORDER — ENSURE PUDDING PO PUDG: 1.0000 | Freq: Three times a day (TID) | ORAL | Status: AC

## 2013-05-01 MED ORDER — POTASSIUM CHLORIDE CRYS ER 10 MEQ PO TBCR
10.0000 meq | EXTENDED_RELEASE_TABLET | Freq: Once | ORAL | Status: AC
  Administered 2013-05-01: 10 meq via ORAL
  Filled 2013-05-01: qty 1

## 2013-05-01 MED ORDER — HEPARIN SODIUM (PORCINE) 1000 UNIT/ML IJ SOLN
INTRAMUSCULAR | Status: AC
  Filled 2013-05-01: qty 1

## 2013-05-01 MED ORDER — LIDOCAINE-EPINEPHRINE 0.5 %-1:200000 IJ SOLN
INTRAMUSCULAR | Status: AC
Start: 2013-05-01 — End: 2013-05-01
  Filled 2013-05-01: qty 1

## 2013-05-01 MED ORDER — ASPIRIN 81 MG PO CHEW: 81.0000 mg | CHEWABLE_TABLET | Freq: Every day | ORAL | Status: DC

## 2013-05-01 MED ORDER — FOLIC ACID 0.5 MG HALF TAB
0.5000 mg | ORAL_TABLET | Freq: Every day | ORAL | Status: DC
  Filled 2013-05-01: qty 1

## 2013-05-01 MED ORDER — FOLIC ACID 800 MCG PO TABS: 400.0000 ug | ORAL_TABLET | Freq: Every day | ORAL | Status: DC

## 2013-05-01 MED ORDER — OXYCODONE HCL 5 MG PO TABS: 5.0000 mg | ORAL_TABLET | Freq: Once | ORAL | Status: DC | PRN

## 2013-05-01 MED ORDER — FENTANYL CITRATE 0.05 MG/ML IJ SOLN: 25.0000 ug | INTRAMUSCULAR | Status: DC | PRN

## 2013-05-01 MED ORDER — CLONAZEPAM 1 MG PO TABS: 1.0000 mg | ORAL_TABLET | Freq: Every day | ORAL | Status: DC

## 2013-05-01 MED ORDER — ACETAMINOPHEN 500 MG PO TABS: 500.0000 mg | ORAL_TABLET | Freq: Four times a day (QID) | ORAL | Status: DC | PRN

## 2013-05-01 MED ORDER — HEPARIN SODIUM (PORCINE) 1000 UNIT/ML IJ SOLN
INTRAMUSCULAR | Status: DC | PRN
  Administered 2013-05-01: 10 mL

## 2013-05-01 MED ORDER — OXYCODONE HCL 5 MG/5ML PO SOLN: 5.0000 mg | Freq: Once | ORAL | Status: DC | PRN

## 2013-05-01 MED ORDER — VANCOMYCIN HCL IN DEXTROSE 1-5 GM/200ML-% IV SOLN
INTRAVENOUS | Status: AC
  Administered 2013-05-01: 1000 mg via INTRAVENOUS
  Filled 2013-05-01: qty 200

## 2013-05-01 MED ORDER — FLUTICASONE PROPIONATE 50 MCG/ACT NA SUSP
2.0000 | Freq: Every day | NASAL | Status: DC
  Filled 2013-05-01: qty 16

## 2013-05-01 MED ORDER — POTASSIUM CHLORIDE CRYS ER 20 MEQ PO TBCR: 20.0000 meq | EXTENDED_RELEASE_TABLET | Freq: Once | ORAL | Status: DC

## 2013-05-01 MED ORDER — MEPERIDINE HCL 25 MG/ML IJ SOLN: 6.2500 mg | INTRAMUSCULAR | Status: DC | PRN

## 2013-05-01 MED ORDER — SODIUM CHLORIDE 0.9 % IV SOLN
INTRAVENOUS | Status: DC | PRN
  Administered 2013-05-01: 09:00:00 via INTRAVENOUS

## 2013-05-01 MED ORDER — ASPIRIN 81 MG PO CHEW: 81.0000 mg | CHEWABLE_TABLET | Freq: Every day | ORAL | Status: AC

## 2013-05-01 MED ORDER — VORTIOXETINE HBR 10 MG PO TABS: 20.0000 mg | ORAL_TABLET | Freq: Every day | ORAL | Status: DC

## 2013-05-01 MED ORDER — PROPOFOL INFUSION 10 MG/ML OPTIME
INTRAVENOUS | Status: DC | PRN
  Administered 2013-05-01: 100 ug/kg/min via INTRAVENOUS

## 2013-05-01 MED ORDER — LIDOCAINE-EPINEPHRINE 0.5 %-1:200000 IJ SOLN
INTRAMUSCULAR | Status: DC | PRN
  Administered 2013-05-01: 50 mL

## 2013-05-01 MED ORDER — 0.9 % SODIUM CHLORIDE (POUR BTL) OPTIME
TOPICAL | Status: DC | PRN
  Administered 2013-05-01: 1000 mL

## 2013-05-01 MED ORDER — PROMETHAZINE HCL 25 MG/ML IJ SOLN: 6.2500 mg | INTRAMUSCULAR | Status: DC | PRN

## 2013-05-01 MED ORDER — MIDAZOLAM HCL 2 MG/2ML IJ SOLN
INTRAMUSCULAR | Status: AC
  Filled 2013-05-01: qty 2

## 2013-05-01 SURGICAL SUPPLY — 52 items
APL SKNCLS STERI-STRIP NONHPOA (GAUZE/BANDAGES/DRESSINGS) ×1
BAG BANDED W/RUBBER/TAPE 36X54 (MISCELLANEOUS) ×3 IMPLANT
BAG DECANTER FOR FLEXI CONT (MISCELLANEOUS) ×3 IMPLANT
BAG EQP BAND 135X91 W/RBR TAPE (MISCELLANEOUS) ×1
BAG SNAP BAND KOVER 36X36 (MISCELLANEOUS) ×2 IMPLANT
BENZOIN TINCTURE PRP APPL 2/3 (GAUZE/BANDAGES/DRESSINGS) ×2 IMPLANT
CATH CANNON HEMO 15F 50CM (CATHETERS) IMPLANT
CATH CANNON HEMO 15FR 19 (HEMODIALYSIS SUPPLIES) IMPLANT
CATH CANNON HEMO 15FR 23CM (HEMODIALYSIS SUPPLIES) ×3 IMPLANT
CATH CANNON HEMO 15FR 31CM (HEMODIALYSIS SUPPLIES) IMPLANT
CATH CANNON HEMO 15FR 32 (HEMODIALYSIS SUPPLIES) IMPLANT
CATH CANNON HEMO 15FR 32CM (HEMODIALYSIS SUPPLIES) IMPLANT
CLOSURE WOUND 1/2 X4 (GAUZE/BANDAGES/DRESSINGS) ×1
COVER DOME SNAP 22 D (MISCELLANEOUS) ×2 IMPLANT
COVER PROBE W GEL 5X96 (DRAPES) ×2 IMPLANT
COVER SURGICAL LIGHT HANDLE (MISCELLANEOUS) ×3 IMPLANT
DECANTER SPIKE VIAL GLASS SM (MISCELLANEOUS) ×3 IMPLANT
DRAPE C-ARM 42X72 X-RAY (DRAPES) ×1 IMPLANT
DRAPE CHEST BREAST 15X10 FENES (DRAPES) ×3 IMPLANT
GAUZE SPONGE 2X2 8PLY STRL LF (GAUZE/BANDAGES/DRESSINGS) ×1 IMPLANT
GAUZE SPONGE 4X4 16PLY XRAY LF (GAUZE/BANDAGES/DRESSINGS) ×3 IMPLANT
GLOVE BIOGEL PI IND STRL 7.0 (GLOVE) IMPLANT
GLOVE BIOGEL PI INDICATOR 7.0 (GLOVE) ×6
GLOVE ECLIPSE 6.5 STRL STRAW (GLOVE) ×4 IMPLANT
GLOVE SS BIOGEL STRL SZ 7.5 (GLOVE) ×1 IMPLANT
GLOVE SUPERSENSE BIOGEL SZ 7.5 (GLOVE) ×2
GOWN STRL REUS W/ TWL LRG LVL3 (GOWN DISPOSABLE) ×3 IMPLANT
GOWN STRL REUS W/TWL LRG LVL3 (GOWN DISPOSABLE) ×9
KIT BASIN OR (CUSTOM PROCEDURE TRAY) ×3 IMPLANT
KIT ROOM TURNOVER OR (KITS) ×3 IMPLANT
NDL 18GX1X1/2 (RX/OR ONLY) (NEEDLE) ×1 IMPLANT
NDL HYPO 25GX1X1/2 BEV (NEEDLE) ×1 IMPLANT
NEEDLE 18GX1X1/2 (RX/OR ONLY) (NEEDLE) ×3 IMPLANT
NEEDLE 22X1 1/2 (OR ONLY) (NEEDLE) ×3 IMPLANT
NEEDLE HYPO 25GX1X1/2 BEV (NEEDLE) ×3 IMPLANT
NS IRRIG 1000ML POUR BTL (IV SOLUTION) ×3 IMPLANT
PACK SURGICAL SETUP 50X90 (CUSTOM PROCEDURE TRAY) ×3 IMPLANT
PAD ARMBOARD 7.5X6 YLW CONV (MISCELLANEOUS) ×6 IMPLANT
SOAP 2 % CHG 4 OZ (WOUND CARE) ×3 IMPLANT
SPONGE GAUZE 2X2 STER 10/PKG (GAUZE/BANDAGES/DRESSINGS) ×2
STRIP CLOSURE SKIN 1/2X4 (GAUZE/BANDAGES/DRESSINGS) ×1 IMPLANT
SUT ETHILON 3 0 PS 1 (SUTURE) ×3 IMPLANT
SUT VICRYL 4-0 PS2 18IN ABS (SUTURE) ×3 IMPLANT
SYR 20CC LL (SYRINGE) ×3 IMPLANT
SYR 30ML LL (SYRINGE) IMPLANT
SYR 5ML LL (SYRINGE) ×6 IMPLANT
SYR CONTROL 10ML LL (SYRINGE) ×3 IMPLANT
SYRINGE 10CC LL (SYRINGE) ×3 IMPLANT
TAPE CLOTH SURG 4X10 WHT LF (GAUZE/BANDAGES/DRESSINGS) ×2 IMPLANT
TOWEL OR 17X24 6PK STRL BLUE (TOWEL DISPOSABLE) ×3 IMPLANT
TOWEL OR 17X26 10 PK STRL BLUE (TOWEL DISPOSABLE) ×3 IMPLANT
WATER STERILE IRR 1000ML POUR (IV SOLUTION) ×3 IMPLANT

## 2013-05-01 NOTE — Preoperative (Signed)
Beta Blockers   Reason not to administer Beta Blockers:Not Applicable 

## 2013-05-01 NOTE — Progress Notes (Signed)
[facility]  [phone]   The patient was not seen. She was discharged HOSPITAL PROGRESS NOTE  Events since 2/3 noted. No plasmapheresis performed since 04/28/13. Platelet count improved.. No bleeding issues. She had he placement of tunnel catheter for plasmapheresis today .She just returned from the procedure, shereports being very tired at this time   Denies pain. No respiratory or cardiac issues. She did have her TEE on 2/3, no thrombus seen.She is interactive however, with less periods of confusion,  able to follow simple commands. Neurology involvement appreciated.     MEDICATIONS:  Scheduled Meds: . feeding supplement (ENSURE)  1 Container Oral TID BM  . pantoprazole  40 mg Oral Daily  . simvastatin  20 mg Oral q1800   Continuous Infusions: . sodium chloride Stopped (04/30/13 1430)   PRN Meds:.diphenhydrAMINE, ondansetron (ZOFRAN) IV, RESOURCE THICKENUP CLEAR, sodium chloride ALLERGIES:   Allergies  Allergen Reactions  . Penicillins Anaphylaxis     PHYSICAL EXAMINATION:   Filed Vitals:   05/01/13 0525  BP: 153/69  Pulse: 64  Temp: 97.9 F (36.6 C)  Resp: 22   Filed Weights   04/28/13 1113 04/29/13 0534 05/01/13 0525  Weight: 147 lb 8 oz (66.906 kg) 146 lb 13.2 oz (66.6 kg) 141 lb 1.5 oz (64 kg)   GENERAL:  no distress, letargic secondary to sedation, but arousable SKIN: skin color, texture, turgor are normal, noted some petechiae rash, no ischemic changes  EYES: normal, conjunctiva are pink and non-injected, sclera clear  OROPHARYNX:no exudate, no erythema and lips, buccal mucosa, and tongue normal  NECK: supple, thyroid normal size, non-tender, without nodularity  LYMPH: no palpable lymphadenopathy in the cervical, axillary or inguinal  LUNGS: clear to auscultation and percussion with normal breathing effort  HEART: regular rate & rhythm and no murmurs and no lower extremity edema  ABDOMEN:abdomen soft, non-tender and normal bowel  sounds  Musculoskeletal:no cyanosis of digits and no clubbing  PSYCH: alert , only oriented in person with dysarthria  NEURO: Evidence of hemi neglect and left-sided weakness  LABORATORY/RADIOLOGY DATA:   Recent Labs Lab 04/28/13 1910 04/29/13 0400 04/29/13 1600 04/30/13 0440 05/01/13 0413  WBC 6.0 5.8 5.9 7.1 7.6  HGB 7.8* 8.2* 7.2* 8.2* 8.6*  HCT 24.1* 24.3* 21.7* 24.9* 25.9*  PLT 185 184 176 214 235  MCV 95.6 94.9 94.3 95.0 94.5  MCH 31.0 32.0 31.3 31.3 31.4  MCHC 32.4 33.7 33.2 32.9 33.2  RDW 15.6* 15.3 15.4 15.9* 16.1*    CMP    Recent Labs Lab 04/25/13 0440  04/27/13 0400  04/28/13 0500 04/28/13 1028 04/29/13 0500 04/30/13 0440 05/01/13 0413  NA 152*  < > 151*  < > 152* 150* 147 145 144  K 3.0*  < > 4.0  < > 3.2* 3.0* 2.9* 3.4* 3.6*  CL 110  < > 110  < > 108 102 104 106 108  CO2 29  < > 26  --  30  --  28 21 21   GLUCOSE 114*  < > 117*  < > 89 107* 86 81 78  BUN 27*  < > 21  < > 17 16 13 11 12   CREATININE 2.31*  < > 1.83*  < > 1.87* 1.90* 1.76* 1.54* 1.52*  CALCIUM 8.2*  < > 8.5  --  8.7  --  8.5 8.1* 8.2*  MG  --   --   --   --   --   --  1.2*  --   --  AST 39*  --   --   --   --   --   --   --  28  ALT 21  --   --   --   --   --   --   --  22  ALKPHOS 65  --   --   --   --   --   --   --  65  BILITOT 0.7  --   --   --   --   --   --   --  0.5  < > = values in this interval not displayed.      Component Value Date/Time   BILITOT 0.5 05/01/2013 0413   BILIDIR <0.2 05/01/2013 0413   IBILI NOT CALCULATED 05/01/2013 0413      Urinalysis    Component Value Date/Time   COLORURINE YELLOW 04/26/2013 1443   APPEARANCEUR CLEAR 04/26/2013 1443   LABSPEC 1.015 04/26/2013 1443   PHURINE 8.5* 04/26/2013 1443   GLUCOSEU NEGATIVE 04/26/2013 1443   HGBUR SMALL* 04/26/2013 1443   BILIRUBINUR NEGATIVE 04/26/2013 1443   KETONESUR NEGATIVE 04/26/2013 1443   PROTEINUR NEGATIVE 04/26/2013 1443   UROBILINOGEN 1.0 04/26/2013 1443   NITRITE NEGATIVE 04/26/2013 1443    LEUKOCYTESUR NEGATIVE 04/26/2013 1443   Liver Function Tests:  Recent Labs Lab 04/25/13 0440  04/27/13 0400 04/28/13 0500 04/29/13 0500 04/30/13 0440 05/01/13 0413  AST 39*  --   --   --   --   --  28  ALT 21  --   --   --   --   --  22  ALKPHOS 65  --   --   --   --   --  65  BILITOT 0.7  --   --   --   --   --  0.5  PROT 5.9*  --   --   --   --   --  6.5  ALBUMIN 3.4*  < > 3.5 3.5 3.4* 3.6 3.4*  3.4*  < > = values in this interval not displayed. 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  EXAM: CT HEAD WITHOUT CONTRAST  TECHNIQUE: Contiguous axial images were obtained from the base of the skull through the vertex without intravenous contrast.  COMPARISON:  CT HEAD W/O CM dated 04/23/2013  FINDINGS: Further evolution of the large right middle cerebral artery distribution stroke, with involvement of the right temporal, frontal and parietal lobes. Minimal petechial hemorrhage at the anterior extent of the stroke in the posterior right frontal lobe consistent with rib bloods reperfusion. Mass effect with effacement of cortical sulci in the right cerebral hemisphere, slight compression of the right lateral ventricle, and slight shift of the midline to the left approximating 2-3 mm. No evidence of uncal or transtentorial herniation.  Encephalomalacia involving the left frontal lobe, left parietal lobe, and left occipital lobe,  related to old  cortical strokes. No evidence of hydrocephalus. No new intracranial abnormality elsewhere.  No focal osseous abnormality involving the skull. Mucous retention cyst or polyp in the left maxillary sinus and minimal mucosal thickening involving the left sphenoid sinus, unchanged. Remaining paranasal sinuses, bilateral mastoid air cells and bilateral middle ear cavities well aerated. Bilateral carotid siphon atherosclerosis.  IMPRESSION: 1. Further evolution of the large right middle cerebral artery distribution stroke, with minimal petechial hemorrhage at the anterior extent of the infarct in the posterior left frontal lobe as a result of luxury perfusion. No significant intracranial hemorrhage. 2. Mass effect with slight shift of the midline to the left approximating 2-3 mm. No evidence of transtentorial or uncal herniation. 3. No new intracranial abnormality elsewhere.   Electronically Signed   By: Hulan Saas M.D.   On: 04/26/2013 21:54   Ct Head Wo Contrast  04/23/2013   CLINICAL DATA:  53 year old female with hypertension and hypercholesterolemia down down with new left arm and leg weakness with slurred speech. TTP with platelets of 10.  EXAM: CT HEAD WITHOUT CONTRAST  TECHNIQUE: Contiguous axial images were obtained from the base of the skull through the vertex without intravenous contrast.  COMPARISON:  None.  FINDINGS: Exam is motion degraded.  Large nonhemorrhagic acute right middle cerebral artery distribution infarct involving portions of the right temporal lobe, right operculum region, right subinsular region, right frontal lobe and right parietal lobe. Local mass effect with slight compression of the right lateral ventricle without midline shift.  Remote infarct with encephalomalacia left posterior temporal -parietal lobe.  Baseline atrophy without hydrocephalus.  No intracranial mass lesion noted on this unenhanced exam.  Mild mucosal thickening paranasal sinuses.  IMPRESSION: Exam is motion  degraded.  Large nonhemorrhagic acute right middle cerebral artery distribution infarct with slight compression of the right lateral ventricle without midline shift. Patient is at risk for development of further mass effect/hemorrhage as the infarct evolves.  Remote infarct with encephalomalacia left posterior temporal -parietal lobe.  These results were called by telephone at the time of interpretation on 04/23/2013 at 3:16 PM to Dr. Amada Jupiter who verbally acknowledged these results.   Electronically Signed   By: Bridgett Larsson M.D.   On: 04/23/2013 15:19   US Renal Port  04/23/2013   CLINICAL DATA:  Acute renal insufficiency  EXAM: RENAL/URINARY TRACT ULTRASOUND COMPLETE  COMPARISON:  None  FINDINGS: Right Kidney:  Length: 9.8 cm. Normal cortical thickness. Increased cortical echogenicity. No mass, hydronephrosis or shadowing calcification. Probable tiny amount of fluid adjacent to the upper pole of the left kidney. .  Left Kidney:  Length: 12.8 cm. Normal cortical thickness. Increased cortical echogenicity. No mass, hydronephrosis shadowing calcification. No perinephric fluid collection.  Bladder:  Appears normal for degree of bladder distention.  IMPRESSION: Increased renal cortical echogenicity bilaterally suggesting medical renal disease.  No evidence of renal mass or hydronephrosis.  Probable tiny amount of fluid adjacent to the upper pole of the left kidney, nonspecific.   Electronically Signed   By: Ulyses Southward M.D.   On: 04/23/2013 13:57   Dg Chest Port 1 View  04/27/2013   CLINICAL DATA:  Atelectasis  EXAM: PORTABLE CHEST - 1 VIEW  COMPARISON:  04/24/2013  FINDINGS: Left IJ high flow catheter extends to the cavoatrial junction as before. Lungs clear. Heart size normal. . No effusion. Mild thoracolumbar levoscoliosis possibly positional.  IMPRESSION: No acute cardiopulmonary disease.   Electronically Signed   By: Kerry Kass.D.  On: 04/27/2013 08:24   Dg Chest Port 1 View  04/24/2013    CLINICAL DATA:  Respiratory failure  EXAM: PORTABLE CHEST - 1 VIEW  COMPARISON:  Prior chest x-ray 04/22/2013  FINDINGS: Stable position of non tunneled left IJ approach hemodialysis catheter. The catheter tip is in good position at the distal SVC. Stable cardiac and mediastinal contours. Slightly increased pulmonary vascular congestion but no overt edema. No new airspace consolidation, pleural effusion or pneumothorax. No acute osseous abnormality.  IMPRESSION: 1. Slightly increased pulmonary vascular congestion without evidence of pulmonary edema. 2. Stable position of left IJ hemodialysis catheter.   Electronically Signed   By: Malachy MoanHeath  McCullough M.D.   On: 04/24/2013 07:48   Dg Chest Port 1 View  04/22/2013   CLINICAL DATA:  Left IJ dialysis catheter insertion  EXAM: PORTABLE CHEST - 1 VIEW  COMPARISON:  None.  FINDINGS: Left IJ dialysis catheter tip terminates in the mid SVC level. Normal heart size and vascularity. Lungs remain clear. No effusion or pneumothorax.  IMPRESSION: Left IJ dialysis catheter tip mid SVC level  No acute finding   Electronically Signed   By: Ruel Favorsrevor  Shick M.D.   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 Platelets today at 214. No plasmapheresis to be given today. Appreciate VVS placement of vascular cath for outpatient pheresis (SNF versus Rehab) .  LDH today is 379 from  369 .   schistocytes in the peripheral blood smear. Will continue to follow her closely on a daily basis. I suspect she may need plasmapheresis soon .  #2 anemia  Improved since prior. Today is 8.6 from  8.2 No active bleeding issues. Discussed with primary service, consider transfusion if hemoglobin <7.Hepatic panel shows normal Nili, essentially unremarkable, reticulocyte count pending. Hapto is 120 (from 68 on 1/27) . If she started having evidence of hemolysis again, she will need resumption of plasmapheresis   #3 acute renal failure in the  setting of TTP  S/P plasmapheresis as above, improving. Renal service signed off.   #4 history of stroke  Appreciate Neurology evaluation. For TEE today to rule out cardioembolic source.  Appreciate speech pathology and PT involvement.  Plavix Not recommended due to risk of TTP with Plavix. Recommend to start her on aspirin   #5 disposition/discharge planning  I had the opportunity to meet with her sister and brother-in-law. Both of them have medical backgrounds. Her sister is willing to take her home once the patient completed rehabilitation.However, per chart report , [employer] has denied admission to rehab even after peer to peer review with Dr. Pearlean BrownieSethi. Plans as per admitting team.  Marcos EkeWERTMAN,SARA E, PA-C 05/01/2013, 8:24 AM

## 2013-05-01 NOTE — Anesthesia Procedure Notes (Signed)
Date/Time: 05/01/2013 9:37 AM Performed by: Rogelia BogaMUELLER, Margeret Stachnik P Pre-anesthesia Checklist: Patient identified, Emergency Drugs available, Suction available, Patient being monitored and Timeout performed Patient Re-evaluated:Patient Re-evaluated prior to inductionOxygen Delivery Method: Simple face mask Placement Confirmation: positive ETCO2

## 2013-05-01 NOTE — Anesthesia Preprocedure Evaluation (Addendum)
Anesthesia Evaluation  Patient identified by MRN, date of birth, ID band Patient awake    Reviewed: Allergy & Precautions, H&P , NPO status , Patient's Chart, lab work & pertinent test results  History of Anesthesia Complications Negative for: history of anesthetic complications  Airway Mallampati: II TM Distance: >3 FB Neck ROM: Full    Dental  (+) Edentulous Upper and Edentulous Lower   Pulmonary Current Smoker,  breath sounds clear to auscultation        Cardiovascular hypertension, Pt. on medications Rhythm:Regular Rate:Normal  1/15 ECHO: normal LVF, EF 60-65%, valves OK   Neuro/Psych Depression CVA (on plavix), Residual Symptoms    GI/Hepatic negative GI ROS, (+) Hepatitis -, C  Endo/Other  negative endocrine ROS  Renal/GU Renal InsufficiencyRenal disease (creat 1.52)     Musculoskeletal   Abdominal   Peds  Hematology  (+) Blood dyscrasia (h/o TTP, plts 235, anemia, HB 8.6), anemia ,   Anesthesia Other Findings Pt has TTP - Platelets 235 this am CVA with Left sided weakness  Reproductive/Obstetrics                       Anesthesia Physical Anesthesia Plan  ASA: III  Anesthesia Plan: MAC   Post-op Pain Management:    Induction: Intravenous  Airway Management Planned: Natural Airway and Simple Face Mask  Additional Equipment:   Intra-op Plan:   Post-operative Plan:   Informed Consent: I have reviewed the patients History and Physical, chart, labs and discussed the procedure including the risks, benefits and alternatives for the proposed anesthesia with the patient or authorized representative who has indicated his/her understanding and acceptance.     Plan Discussed with: CRNA and Surgeon  Anesthesia Plan Comments: (Plan routine monitors, MAC)        Anesthesia Quick Evaluation

## 2013-05-01 NOTE — Discharge Instructions (Signed)
STROKE/TIA DISCHARGE INSTRUCTIONS SMOKING Cigarette smoking nearly doubles your risk of having a stroke & is the single most alterable risk factor  If you smoke or have smoked in the last 12 months, you are advised to quit smoking for your health.  Most of the excess cardiovascular risk related to smoking disappears within a year of stopping.  Ask you doctor about anti-smoking medications  [place] Quit Line: 1-800-QUIT NOW  Free Smoking Cessation Classes [phone]  CHOLESTEROL Know your levels; limit fat & cholesterol in your diet  Lipid Panel     Component Value Date/Time   CHOL 122 04/24/2013 0313   TRIG 99 04/24/2013 0313   HDL 37* 04/24/2013 0313   CHOLHDL 3.3 04/24/2013 0313   VLDL 20 04/24/2013 0313   LDLCALC 65 04/24/2013 0313      Many patients benefit from treatment even if their cholesterol is at goal.  Goal: Total Cholesterol (CHOL) less than 160  Goal:  Triglycerides (TRIG) less than 150  Goal:  HDL greater than 40  Goal:  LDL (LDLCALC) less than 100   BLOOD PRESSURE American Stroke Association blood pressure target is less that 120/80 mm/Hg  Your discharge blood pressure is:  BP: 163/82 mmHg  Monitor your blood pressure  Limit your salt and alcohol intake  Many individuals will require more than one medication for high blood pressure  DIABETES (A1c is a blood sugar average for last 3 months) Goal HGBA1c is under 7% (HBGA1c is blood sugar average for last 3 months)  Diabetes: No known diagnosis of diabetes    Lab Results  Component Value Date   HGBA1C 4.8 04/23/2013     Your HGBA1c can be lowered with medications, healthy diet, and exercise.  Check your blood sugar as directed by your physician  Call your physician if you experience unexplained or low blood sugars.  PHYSICAL ACTIVITY/REHABILITATION Goal is 30 minutes at least 4 days per week  Activity: Increase activity slowly, Therapies: Physical Therapy: Nursing Facility Return to work: NA  Activity  decreases your risk of heart attack and stroke and makes your heart stronger.  It helps control your weight and blood pressure; helps you relax and can improve your mood.  Participate in a regular exercise program.  Talk with your doctor about the best form of exercise for you (dancing, walking, swimming, cycling).  DIET/WEIGHT Goal is to maintain a healthy weight  Your discharge diet is: Dysphagia2  thin liquids Your height is:  Height: 5\' 5"  (165.1 cm) Your current weight is: Weight: 64 kg (141 lb 1.5 oz) Your Body Mass Index (BMI) is:  BMI (Calculated): 24.6  Following the type of diet specifically designed for you will help prevent another stroke.  Your goal weight range is:    Your goal Body Mass Index (BMI) is 19-24.  Healthy food habits can help reduce 3 risk factors for stroke:  High cholesterol, hypertension, and excess weight.  RESOURCES Stroke/Support Group:  Call [phone]   STROKE EDUCATION PROVIDED/REVIEWED AND GIVEN TO PATIENT Stroke warning signs and symptoms How to activate emergency medical system (call 911). Medications prescribed at discharge. Need for follow-up after discharge. Personal risk factors for stroke. Pneumonia vaccine given: No Flu vaccine given: No My questions have been answered, the writing is legible, and I understand these instructions.  I will adhere to these goals & educational materials that have been provided to me after my discharge from the hospital.

## 2013-05-01 NOTE — Progress Notes (Signed)
CSW (Clinical Child psychotherapistocial Worker) aware pt denied CIR. Pt family would like for pt to dc to [facility] if possible. CSW did inform that we are working on trying to have pt dc to this facility today. Pt sister okay with this. CSW informed facility and asked them to being precert for insurance as soon as possible. CSW also called insurance and updated on facility choice and pt being ready for dc today.  Srihari Shellhammer, LCSWA [phone]

## 2013-05-01 NOTE — Progress Notes (Signed)
CSW (Clinical Child psychotherapistocial Worker) prepared pt dc packet and placed with shadow chart. CSW arranged non-emergent ambulance transport. Pt, pt sister, pt nurse, and facility informed. Pt sister said she would be speaking with pt daughter and updating her on pt dc to facility. CSW signing off.   Tanette Chauca, LCSWA [phone]

## 2013-05-01 NOTE — Progress Notes (Signed)
IP Rehab--[employer] has denied admission to rehab even after peer to peer review with Dr. Pearlean BrownieSethi.  I phoned pt's sister and Britta MccreedyBarbara and pt's daughter Victorino DikeJennifer and discussed with them.  I have alerted RN CM and SW .  Will sign off.   Weldon PickingSusan Pate Aylward, PT [phone]  Ottie GlazierBarbara Boyette RN [phone]

## 2013-05-01 NOTE — Progress Notes (Signed)
Stroke Team Progress Note  HISTORY Kaitlyn Valdez is an 53 y.o. female with history of stroke that affected her RIGHT side per family member who are at bedside. Daughter states she had been talking to her on the phone between the hours of 9-10 AM and she was speaking normally. Her aunt found patient around 72 AM 04/21/2013 down at home, and per note she was confused, showed slurred speech and lever left sided neglect. Family at bedside state the left sided neglect is new. Patient was transferred to [facility] hospital from [facility] due to PLT count 22 and concern for TTP. TPE was initiated and patient was brought to [facility] hospital. Neurology was consulted concerning possibility of adding Plavix to TPE for embolic prevention.  Patient was not administerd TPA secondary to PLT count of 10. She was admitted to the ICU for further evaluation and treatment.  SUBJECTIVE Patient just back from catheter placement.  OBJECTIVE Most recent Vital Signs: Filed Vitals:   05/01/13 1034 05/01/13 1049 05/01/13 1103 05/01/13 1125  BP: 155/97 157/77 163/82   Pulse: 79 65 62   Temp:    98.4 F (36.9 C)  TempSrc:      Resp: 19 21 22    Height:      Weight:      SpO2: 100% 100% 100%    CBG (last 3)   Recent Labs  04/28/13 1220  GLUCAP 94    IV Fluid Intake:      MEDICATIONS  . clonazePAM  1 mg Oral QHS  . [START ON 05/02/2013] clopidogrel  75 mg Oral Q breakfast  . feeding supplement (ENSURE)  1 Container Oral TID BM  . fluticasone  2 spray Each Nare Daily  . folic acid  0.5 mg Oral Daily  . pantoprazole  40 mg Oral Daily  . simvastatin  20 mg Oral q1800  . Vortioxetine HBr  20 mg Oral Daily   PRN:  acetaminophen, diphenhydrAMINE, ondansetron (ZOFRAN) IV, RESOURCE THICKENUP CLEAR, sodium chloride  Diet:  Dysphagia 2 thin liquids Activity:  OOB DVT Prophylaxis:  SCDs   CLINICALLY SIGNIFICANT STUDIES Basic Metabolic Panel:   Recent Labs Lab 04/29/13 0500 04/30/13 0440 05/01/13 0413  NA 147 145 144   K 2.9* 3.4* 3.6*  CL 104 106 108  CO2 28 21 21   GLUCOSE 86 81 78  BUN 13 11 12   CREATININE 1.76* 1.54* 1.52*  CALCIUM 8.5 8.1* 8.2*  MG 1.2*  --   --   PHOS 4.2 2.8 3.3   Liver Function Tests:  Recent Labs Lab 04/25/13 0440  04/30/13 0440 05/01/13 0413  AST 39*  --   --  28  ALT 21  --   --  22  ALKPHOS 65  --   --  65  BILITOT 0.7  --   --  0.5  PROT 5.9*  --   --  6.5  ALBUMIN 3.4*  < > 3.6 3.4*  3.4*  < > = values in this interval not displayed. CBC:   Recent Labs Lab 04/30/13 0440 05/01/13 0413  WBC 7.1 7.6  HGB 8.2* 8.6*  HCT 24.9* 25.9*  MCV 95.0 94.5  PLT 214 235   Coagulation:  No results found for this basename: LABPROT, INR,  in the last 168 hours Cardiac Enzymes:   Recent Labs Lab 04/25/13 1010 04/25/13 1610 04/25/13 2135  TROPONINI 0.34* <0.30 <0.30   Urinalysis:   Recent Labs Lab 04/26/13 1057 04/26/13 1443  COLORURINE YELLOW YELLOW  LABSPEC  1.017 1.015  PHURINE 8.5* 8.5*  GLUCOSEU NEGATIVE NEGATIVE  HGBUR SMALL* SMALL*  BILIRUBINUR NEGATIVE NEGATIVE  KETONESUR NEGATIVE NEGATIVE  PROTEINUR 30* NEGATIVE  UROBILINOGEN 1.0 1.0  NITRITE NEGATIVE NEGATIVE  LEUKOCYTESUR NEGATIVE NEGATIVE   Lipid Panel    Component Value Date/Time   CHOL 122 04/24/2013 0313   TRIG 99 04/24/2013 0313   HDL 37* 04/24/2013 0313   CHOLHDL 3.3 04/24/2013 0313   VLDL 20 04/24/2013 0313   LDLCALC 65 04/24/2013 0313   HgbA1C  Lab Results  Component Value Date   HGBA1C 4.8 04/23/2013    Urine Drug Screen:   No results found for this basename: labopia,  cocainscrnur,  labbenz,  amphetmu,  thcu,  labbarb    Alcohol Level: No results found for this basename: ETH,  in the last 168 hours  Dg Chest 1 View  05/01/2013   CLINICAL DATA:  Central line placement.  EXAM: CHEST - 1 VIEW  COMPARISON:  04/27/2013  FINDINGS: 1043 hrs. A new right IJ dialysis catheter is visualized with distal tip positioned over the mid right atrium. There is no evidence for pneumothorax.  No pulmonary edema or focal lung consolidation. The cardiopericardial silhouette is within normal limits for size. Telemetry leads overlie the chest.  IMPRESSION: Right IJ central line tip overlies the mid right atrium. No pneumothorax.   Electronically Signed   By: Kennith Center M.D.   On: 05/01/2013 11:02    CT of the brain  04/26/2013  1. Further evolution of the large right middle cerebral artery distribution stroke, with minimal petechial hemorrhage at the anterior extent of the infarct in the posterior left frontal lobe as a result of luxury perfusion. No significant intracranial hemorrhage. 2. Mass effect with slight shift of the midline to the left approximating 2-3 mm. No evidence of transtentorial or uncal herniation. 3. No new intracranial abnormality elsewhere. 04/23/2013 Exam is motion degraded. Large nonhemorrhagic acute right middle cerebral artery distribution infarct with slight compression of the right lateral ventricle without midline shift. Patient is at risk for development of further mass effect/hemorrhage as the infarct evolves. Remote infarct with encephalomalacia left posterior temporal -parietal lobe.   2D Echocardiogram  EF 60-65% with no source of embolus.   Carotid Doppler  Bilateral: 1-39% ICA stenosis. Vertebral artery flow is antegrade.   TCD This was a abnormal transcranial Doppler study, with low right middle cerebral artery mean flow velocities suggesting distal stenosis and poor left temporal and occipital windows limiting evaluation..   TEE 04/30/2013 Normal LV size and systolic function, EF 60-65%. Normal RV size and systolic function. Mild LAE. No LAA thrombus. No PFO/ASD. There was grade III plaque just adjacent to the take-off of the left subclavian in the arch/proximal descending thoracic aorta.   CXR   04/27/2013 No acute cardiopulmonary disease.  04/24/2013 1. Slightly increased pulmonary vascular congestion without evidence of pulmonary edema. 2. Stable  position of left IJ hemodialysis catheter.  04/22/2013 Left IJ dialysis catheter tip mid SVC level No acute finding   EKG  Sinus bradycardia with short PR. Nonspecific ST abnormality  Therapy Recommendations  CIR  Physical Exam   Elderly caucasian lady not in distress. Afebrile. Head is nontraumatic. Neck is supple without bruit. Cardiac exam no murmur or gallop. Lungs are clear to auscultation. Distal pulses are well felt.  Neurological Exam : Awake alert. Right gaze preference but can look up to the left till midline. Does not blink to threat on the left but does so  on the right. Pupils 4 mm irregular but sluggishly reactive. Fundi were not visualized. Speech is dysarthric and she is disoriented and confused and distractible. Follows simple one-step commands only. Tongue is midline. Left lower facial weakness. Left hemiparesis but will withdraw left upper and lower extremity to painful stimuli. Purposeful movements and right side against gravity. Left plantar is upgoing right is downgoing. Diminished sensation on the left and significant left hemineglect. Gait was not tested.   ASSESSMENT Kaitlyn Valdez is a 53 y.o. female presenting with confusion, slurred speech, and left sided neglect. Imaging confirms a right wedge shaped MCA infarct. Infarct felt to be embolic given 3 prior infarcts in the left brain There is no documented large vessel disease per old records. New large vessel infarcts unlikely to be associated with TTP which typically causes microangiopathy, especially no associated with recurrent strokes. Most likely differentials include cardioembolic source, less likely primary hypercoagulable source. TEE negative for source. On clopidogrel 75 mg orally every day prior to admission. Now on clopidogrel 75 mg orally every day for secondary stroke prevention. Patient with resultant confusion (improved), dysarthria, neurologic neglect, dysphagia (improved), left arm hemiparesis and field cut.    Reviewed old records from Sept 12, 2011,  neurologist Karren CobbleMustafa Hammad, [facility] [place]. Had cerebral angio at that time which showed no large vessel occlusion or stenosis. MRI showed L subacute occipital infarct in setting of old infarcts L frontal, L occipital, R parietal with volume loss. Sept 2011-April 2012 PLT range 102-197 at that time. Hx congenital heart disease with surgical repair.  Note hematology consult that advised plavix to be avoided; ok for aspirin.   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), undergoing plasmaphoresis, Platelets improved 10 to 235  Severe anemia, Stable Hb at 8.6  Acute Renal failure, Cr 1.52  Induced hypernatremia in order to decrease cerebral edema, protocol stopped 04/24/2013, Na drifting down,  Na normalized  Hypertension   Hyperlipidemia, LDL 65, on pravachol 40 mg daily PTA, now on zocor 20 mg daily, goal LDL < 100 (< 70 for diabetics)   Hx strokes - baseline L HP per family suggests old right brain stroke; old left brain strokes seen on imaging   Depression  Hx hepatitis C  Hospital day # 9  TREATMENT/PLAN  Ok for aspirin per hematology; recommend aspirin 81 mg daily for secondary stroke prevention  Will discuss possibility of a loop recorder with cardiologist at follow up appt Doubt benefit of hypercoagulable labs.  Insurance has denied IP Rehab. Dr. Pearlean BrownieSethi discuss with their medical director. Discharge disposition: SNF vs CIR  Kaitlyn BIBY, MSN, RN, ANVP-BC, ANP-BC, GNP-BC [facility] Pager: [phone] 05/01/2013 12:05 PM  I have personally obtained a history, examined the patient, evaluated imaging results, and formulated the assessment and plan of care. I agree with the above. Delia HeadyPramod Sethi, MD

## 2013-05-01 NOTE — Progress Notes (Signed)
CANCELLATION NOTE:   Attempted to see pt. For f/u regarding swallowing and cognitive therapy.  Pt. Is in PACU s/p placement of tunnel catheter for plasmapheresis.  Will return 2/5.

## 2013-05-01 NOTE — Progress Notes (Signed)
Physical Therapy Treatment Patient Details Name: Kaitlyn Valdez MRN: [id number] DOB: [birth year] Today's Date: 05/01/2013 Time: [id number] PT Time Calculation (min): 27 min  PT Assessment / Plan / Recommendation  History of Present Illness 53 y.o. female [facility] with history of stroke that affected her RIGHT side per family member who are at bedside. Daughter states she had been talking to her on the phone between the hours of 9-10 AM and she was speaking normally. Her aunt found patient around 6611 AM 04/21/2013 down at home, and per note she was confused, showed slurred speech and lever left sided neglect. Family at bedside state the left sided neglect is new. [facility] due to PLT count 22 and concern for TTP. TPE was initiated and [facility]. Neurology was consulted concerning possibility of adding Plavix to TPE for embolic prevention.  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  Encephalomalacia involving the left frontal lobe, left parietal lobe, and left occipital lobe, related to old cortical strokes.    PT Comments   Pt is more restless today, more distracted by her own lines and leads.  She needed more support to maintain sitting balance with PT.  CIR was denied and family is now pursuing SNF for rehab after her acute hospital stay.    Follow Up Recommendations  SNF;Other (comment) (CIR was denied by insurance. )     Does the patient have the potential to tolerate intense rehabilitation    NA  Barriers to Discharge   None      Equipment Recommendations  Wheelchair (measurements PT);Wheelchair cushion (measurements PT)    Recommendations for Other Services   None  Frequency Min 3X/week   Progress towards PT Goals Progress towards PT goals: Not progressing toward goals - comment (more restless today)  Plan Discharge plan  needs to be updated;Frequency needs to be updated    Precautions / Restrictions Precautions Precautions: Fall Precaution Comments: very impulsive, L neglect with significant awareness issues.  Left sided weakness.    Pertinent Vitals/Pain See vitals flow sheet.     Mobility  Bed Mobility Overal bed mobility: Needs Assistance Bed Mobility: Supine to Sit;Sit to Supine Supine to sit: Mod assist Sit to supine: Mod assist General bed mobility comments: mod assist to support trunk for balance as pt is leaning posteriorly and to the right.  Mod assist to help pt get both legs back into the bed when going back to supine.  Modified Rankin (Stroke Patients Only) Pre-Morbid Rankin Score:  (not sure of premorbid level) Modified Rankin: Severe disability      PT Goals (current goals can now be found in the care plan section) Acute Rehab PT Goals Patient Stated Goal: to go home  Visit Information  Last PT Received On: 05/01/13 Assistance Needed: +2 History of Present Illness: 53 y.o. female [facility] with history of stroke that affected her RIGHT side per family member who are at bedside. Daughter states she had been talking to her on the phone between the hours of 9-10 AM and she was speaking normally. Her aunt found patient around 5411 AM 04/21/2013 down at home, and per note she was confused, showed slurred speech and lever left sided neglect. Family at bedside state the left sided neglect is new. [facility] due to PLT count 22 and  concern for TTP. TPE was initiated and patient was brought to [facility] hospital. Neurology was consulted concerning possibility of adding Plavix to TPE for embolic prevention.  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  Encephalomalacia involving the left frontal lobe, left parietal lobe, and left occipital lobe, related to old cortical  strokes.     Subjective Data  Subjective: Pt unable to recall walking with me yesterday.  Patient Stated Goal: to go home   Cognition  Cognition Arousal/Alertness: Awake/alert Behavior During Therapy: Restless;Impulsive Overall Cognitive Status: Impaired/Different from baseline Area of Impairment: Attention;Following commands;Safety/judgement;Awareness;Orientation;Memory;Problem solving Orientation Level: Disoriented to;Person;Place;Time;Situation Current Attention Level: Focused Memory: Decreased recall of precautions;Decreased short-term memory Following Commands: Follows one step commands inconsistently Safety/Judgement: Decreased awareness of safety;Decreased awareness of deficits Awareness: Intellectual (she seems to show no awareness of deficits at all) Problem Solving: Slow processing;Decreased initiation;Difficulty sequencing;Requires verbal cues;Requires tactile cues General Comments: Pt is very distracted by lines and tubes today.  She is figiting with foley, IV line, tape over bandages on her neck and needs near constant redirecetion. In sitting EOB her gaze is truned far to the right and she only had one instance of even being able to recognize something at her midline.  She is completely unable to turn her gaze even with PROM of head and neck to the left she is unable to turn her gaze to the left.  Pt reports she is in a motel and when told she is in the hospital and asked why she reports there is something wrong with her neck.  When asked if she can move her left arm she lifts her right arm.      Balance  Balance Overall balance assessment: Needs assistance Sitting-balance support: Single extremity supported;Feet supported Sitting balance-Leahy Scale: Poor Sitting balance - Comments: Pt requires mod to max assist to maintain sitting balance today.  She is starting to push to the left and posteriorly in sitting.  In sitting we worked on less supportive sitting and trying to  find midline with her gaze.  ` Postural control: Posterior lean;Left lateral lean Standing balance support: Single extremity supported  End of Session PT - End of Session Activity Tolerance: Patient limited by pain Patient left: in bed;with call bell/phone within reach;with bed alarm set;with restraints reapplied    Rusell Meneely B. Thompson Mckim, PT, DPT [phone]   05/01/2013, 4:14 PM

## 2013-05-01 NOTE — Interval H&P Note (Signed)
History and Physical Interval Note:  05/01/2013 8:17 AM  Kaitlyn Valdez  has presented today for surgery, with the diagnosis of Thrombotic Thrombocytopenic Purpura  The various methods of treatment have been discussed with the patient and family. After consideration of risks, benefits and other options for treatment, the patient has consented to  Procedure(s): INSERTION OF DIALYSIS CATHETER (N/A) as a surgical intervention .  The patient's history has been reviewed, patient examined, no change in status, stable for surgery.  I have reviewed the patient's chart and labs.  Questions were answered to the patient's satisfaction.     Emrys Mckamie

## 2013-05-01 NOTE — Interval H&P Note (Signed)
History and Physical Interval Note:  05/01/2013 8:19 AM  Kaitlyn Valdez  has presented today for surgery, with the diagnosis of Thrombotic Thrombocytopenic Purpura  The various methods of treatment have been discussed with the patient and family. After consideration of risks, benefits and other options for treatment, the patient has consented to  Procedure(s): INSERTION OF DIALYSIS CATHETER (N/A) as a surgical intervention .  The patient's history has been reviewed, patient examined, no change in status, stable for surgery.  I have reviewed the patient's chart and labs.  Questions were answered to the patient's satisfaction.     Jeannelle Wiens

## 2013-05-01 NOTE — Op Note (Signed)
    OPERATIVE REPORT  DATE OF SURGERY: 05/01/2013  PATIENT: Kaitlyn Valdez, 53 y.o. female MRN: [id number]  DOB: 06-30-1960  PRE-OPERATIVE DIAGNOSIS: TTP  POST-OPERATIVE DIAGNOSIS:  Same  PROCEDURE: Tunnel catheter for plasmapheresis  SURGEON:  Gretta Beganodd Early, M.D.  PHYSICIAN ASSISTANT: Nurse  ANESTHESIA:  Local with sedation  EBL: Minimal ml  Total I/O In: 200 [I.V.:200] Out: -   BLOOD ADMINISTERED: None  DRAINS: None  SPECIMEN: None  COUNTS CORRECT:  YES  PLAN OF CARE: PACU with chest x-ray pending   PATIENT DISPOSITION:  PACU - hemodynamically stable  PROCEDURE DETAILS: The patient was taken to the operating placed supine position where the area of the right neck was prepped and draped in usual sterile fashion. Using SonoSite ultrasound for localization the right internal jugular vein was accessed with 18-gauge needle and a guidewire was passed down to the level of the right atrium this was confirmed with fluoroscopy. A dilator and peel-away sheath were passed over the guidewire and the dilator and guidewire removed. A 23 cm hemodialysis catheter was passed through the peel-away sheath and the peel-away sheath was removed. The catheter tips were positioned level distal right atrium. The catheter was brought through subcutaneous tunnel through a separate stab incision. The 2 lm ports were attached in both lumens flushed and aspirated easily reluctant without unit per cc heparin. The catheter secured to the skin 3-0 nylon stitch and the entry site was closed with a 4-0 subcuticular Vicryl stitch. Sterile dressing was applied and the patient had that removal of the temporary catheter in the left IJ and pressure was held for hemostasis. The patient was taken to the recovery room where chest x-ray is pending   Gretta Beganodd Early, M.D. 05/01/2013 10:26 AM

## 2013-05-01 NOTE — Transfer of Care (Signed)
Immediate Anesthesia Transfer of Care Note  Patient: Kaitlyn Valdez  Procedure(s) Performed: Procedure(s): INSERTION OF DIALYSIS CATHETER; ULTRASOUND GUIDED (N/A)  Patient Location: PACU  Anesthesia Type:MAC  Level of Consciousness: awake and patient cooperative  Airway & Oxygen Therapy: Patient Spontanous Breathing and Patient connected to nasal cannula oxygen  Post-op Assessment: Report given to PACU RN and Post -op Vital signs reviewed and stable  Post vital signs: Reviewed and stable  Complications: No apparent anesthesia complications

## 2013-05-01 NOTE — Discharge Summary (Signed)
Physician Discharge Summary  Kaitlyn Valdez BTD:[id number] DOB: Aug 01, 1960 DOA: 04/22/2013  PCP: Maricela Curet, MD  Admit date: 04/22/2013 Discharge date: 05/01/2013  Time spent: 35 minutes  Recommendations for Outpatient Follow-up:  1-Need cbc , B-met LDH draw on Friday 2-6 at hematologist office. Need to follow up with Dr. Heath Lark 2-6 .  2. Follow up with Dr Leonie Man on 2 month.  3.need voiding trial.   Discharge Diagnoses:    Large Right MCA CVA   TTP (thrombotic thrombocytopenic purpura)   Acute encephalopathy   History of CVA (cerebrovascular accident)   Depression   Essential hypertension, benign   Arterial ischemic stroke, MCA (middle cerebral artery), right, acute   Discharge Condition: Stable.   Diet recommendation: Heart Healthy.   Filed Weights   04/28/13 1113 04/29/13 0534 05/01/13 0525  Weight: 66.906 kg (147 lb 8 oz) 66.6 kg (146 lb 13.2 oz) 64 kg (141 lb 1.5 oz)    History of present illness:  53 yo with past medical history of CVA and baseline L side neglect found down at home, covered in emesis, confused with slurred speech and worsen L sided neglect. Workup revealed thrombocytopenia. Head CT revealed no acute findings but multiple prior infarcts. Transferred to [facility] for plasma phoresis.    Hospital Course:  Large Right MCA CVA  -Presumed related to her TTP.  -TEE negative for PFO, and source of embolism.  -Neurology will consider loop recorder outpatient.  -Discussed with neuro, low dose aspirin. Hematology is ok with low dose aspirin.   TTP  -Patient received plasmapheresis during this admission. Platelet increasing. LDH at 379. Discussed care with Dr Heath Lark, plan to repeat lab work on 2-6. Dr Alvy Bimler will arrange further plasmapheresis if needed.  -S/P  placement of tunneled HD catheter.  Hypernatremia  -Was induced by neuro for cerebral edema.  -Drifiting down slowly.   Hypokalemia  -Repleted. Urine retention. Need voiding trial 2-5.    Procedures:  TEE negative for PFO, thrombus.   Consultations:  Dr Heath Lark hematology  Dr Leonie Man  Discharge Exam: Filed Vitals:   05/01/13 1125  BP:   Pulse:   Temp: 98.4 F (36.9 C)  Resp:     General: No distress.  Cardiovascular: S 1, S 2 RRR Respiratory: CTA  Discharge Instructions      Discharge Orders   Future Appointments Provider Department Dept Phone   05/03/2013 1:00 PM [facility] [phone]   05/03/2013 1:30 PM Heath Lark, MD [facility] [phone]   May 25, 2013 2:30 PM Orvil Feil, NP [facility] [phone]   Future Orders Complete By Expires   Diet - low sodium heart healthy  As directed    Diet - low sodium heart healthy  As directed    Increase activity slowly  As directed    Increase activity slowly  As directed        Medication List    STOP taking these medications       clopidogrel 75 MG tablet  Commonly known as:  PLAVIX     PRISTIQ 50 MG 24 hr tablet  Generic drug:  desvenlafaxine     valsartan 40 MG tablet  Commonly known as:  DIOVAN      TAKE these medications       acetaminophen 500 MG tablet  Commonly known as:  TYLENOL  Take 500 mg by mouth every 6 (six) hours as needed for headache.  aspirin 81 MG chewable tablet  Chew 1 tablet (81 mg total) by mouth daily.  Start taking on:  05/02/2013     BRINTELLIX 10 MG Tabs  Generic drug:  Vortioxetine HBr  Take 20 mg by mouth daily.     clonazePAM 1 MG tablet  Commonly known as:  KLONOPIN  Take 1 mg by mouth at bedtime.     feeding supplement (ENSURE) Pudg  Take 1 Container by mouth 3 (three) times daily between meals.     fluticasone 50 MCG/ACT nasal spray  Commonly known as:  FLONASE  Place 2 sprays into both nostrils daily.     folic acid 0.5 MG tablet  Commonly known as:  FOLVITE  Take 0.5 tablets (0.5 mg total) by mouth daily.     pantoprazole 40 MG  tablet  Commonly known as:  PROTONIX  Take 1 tablet (40 mg total) by mouth daily.     pravastatin 40 MG tablet  Commonly known as:  PRAVACHOL  Take 40 mg by mouth daily.       Allergies  Allergen Reactions  . Penicillins Anaphylaxis   Follow-up Information   Follow up with Maricela Curet, MD.   Specialty:  Internal Medicine   Contact information:   [place] [address] [phone]       Follow up with [facility], NI, MD. (follow up on friday 2-6)    Specialty:  Hematology and Oncology   Contact information:   [place] [phone]        The results of significant diagnostics from this hospitalization (including imaging, microbiology, ancillary and laboratory) are listed below for reference.    Significant Diagnostic Studies: Dg Chest 1 View  05/01/2013   CLINICAL DATA:  Central line placement.  EXAM: CHEST - 1 VIEW  COMPARISON:  04/27/2013  FINDINGS: 1043 hrs. A new right IJ dialysis catheter is visualized with distal tip positioned over the mid right atrium. There is no evidence for pneumothorax. No pulmonary edema or focal lung consolidation. The cardiopericardial silhouette is within normal limits for size. Telemetry leads overlie the chest.  IMPRESSION: Right IJ central line tip overlies the mid right atrium. No pneumothorax.   Electronically Signed   By: Misty Stanley M.D.   On: 05/01/2013 11:02   Ct Head Wo Contrast  04/26/2013   CLINICAL DATA:  Follow-up right middle cerebral artery distribution stroke.  EXAM: CT HEAD WITHOUT CONTRAST  TECHNIQUE: Contiguous axial images were obtained from the base of the skull through the vertex without intravenous contrast.  COMPARISON:  CT HEAD W/O CM dated 04/23/2013  FINDINGS: Further evolution of the large right middle cerebral artery distribution stroke, with involvement of the right temporal, frontal and parietal lobes. Minimal petechial hemorrhage at the anterior extent of the  stroke in the posterior right frontal lobe consistent with rib bloods reperfusion. Mass effect with effacement of cortical sulci in the right cerebral hemisphere, slight compression of the right lateral ventricle, and slight shift of the midline to the left approximating 2-3 mm. No evidence of uncal or transtentorial herniation.  Encephalomalacia involving the left frontal lobe, left parietal lobe, and left occipital lobe, related to old cortical strokes. No evidence of hydrocephalus. No new intracranial abnormality elsewhere.  No focal osseous abnormality involving the skull. Mucous retention cyst or polyp in the left maxillary sinus and minimal mucosal thickening involving the left sphenoid sinus, unchanged. Remaining paranasal sinuses, bilateral mastoid air cells and bilateral middle  ear cavities well aerated. Bilateral carotid siphon atherosclerosis.  IMPRESSION: 1. Further evolution of the large right middle cerebral artery distribution stroke, with minimal petechial hemorrhage at the anterior extent of the infarct in the posterior left frontal lobe as a result of luxury perfusion. No significant intracranial hemorrhage. 2. Mass effect with slight shift of the midline to the left approximating 2-3 mm. No evidence of transtentorial or uncal herniation. 3. No new intracranial abnormality elsewhere.   Electronically Signed   By: Evangeline Dakin M.D.   On: 04/26/2013 21:54   Ct Head Wo Contrast  04/23/2013   CLINICAL DATA:  53 year old female with hypertension and hypercholesterolemia down down with new left arm and leg weakness with slurred speech. TTP with platelets of 10.  EXAM: CT HEAD WITHOUT CONTRAST  TECHNIQUE: Contiguous axial images were obtained from the base of the skull through the vertex without intravenous contrast.  COMPARISON:  None.  FINDINGS: Exam is motion degraded.  Large nonhemorrhagic acute right middle cerebral artery distribution infarct involving portions of the right temporal lobe,  right operculum region, right subinsular region, right frontal lobe and right parietal lobe. Local mass effect with slight compression of the right lateral ventricle without midline shift.  Remote infarct with encephalomalacia left posterior temporal -parietal lobe.  Baseline atrophy without hydrocephalus.  No intracranial mass lesion noted on this unenhanced exam.  Mild mucosal thickening paranasal sinuses.  IMPRESSION: Exam is motion degraded.  Large nonhemorrhagic acute right middle cerebral artery distribution infarct with slight compression of the right lateral ventricle without midline shift. Patient is at risk for development of further mass effect/hemorrhage as the infarct evolves.  Remote infarct with encephalomalacia left posterior temporal -parietal lobe.  These results were called by telephone at the time of interpretation on 04/23/2013 at 3:16 PM to Dr. Leonel Ramsay who verbally acknowledged these results.   Electronically Signed   By: Chauncey Cruel M.D.   On: 04/23/2013 15:19   US Renal Port  04/23/2013   CLINICAL DATA:  Acute renal insufficiency  EXAM: RENAL/URINARY TRACT ULTRASOUND COMPLETE  COMPARISON:  None  FINDINGS: Right Kidney:  Length: 9.8 cm. Normal cortical thickness. Increased cortical echogenicity. No mass, hydronephrosis or shadowing calcification. Probable tiny amount of fluid adjacent to the upper pole of the left kidney. .  Left Kidney:  Length: 12.8 cm. Normal cortical thickness. Increased cortical echogenicity. No mass, hydronephrosis shadowing calcification. No perinephric fluid collection.  Bladder:  Appears normal for degree of bladder distention.  IMPRESSION: Increased renal cortical echogenicity bilaterally suggesting medical renal disease.  No evidence of renal mass or hydronephrosis.  Probable tiny amount of fluid adjacent to the upper pole of the left kidney, nonspecific.   Electronically Signed   By: Lavonia Dana M.D.   On: 04/23/2013 13:57   Dg Chest Port 1  View  04/27/2013   CLINICAL DATA:  Atelectasis  EXAM: PORTABLE CHEST - 1 VIEW  COMPARISON:  04/24/2013  FINDINGS: Left IJ high flow catheter extends to the cavoatrial junction as before. Lungs clear. Heart size normal. . No effusion. Mild thoracolumbar levoscoliosis possibly positional.  IMPRESSION: No acute cardiopulmonary disease.   Electronically Signed   By: Arne Cleveland M.D.   On: 04/27/2013 08:24   Dg Chest Port 1 View  04/24/2013   CLINICAL DATA:  Respiratory failure  EXAM: PORTABLE CHEST - 1 VIEW  COMPARISON:  Prior chest x-ray 04/22/2013  FINDINGS: Stable position of non tunneled left IJ approach hemodialysis catheter. The catheter tip is in good position  at the distal SVC. Stable cardiac and mediastinal contours. Slightly increased pulmonary vascular congestion but no overt edema. No new airspace consolidation, pleural effusion or pneumothorax. No acute osseous abnormality.  IMPRESSION: 1. Slightly increased pulmonary vascular congestion without evidence of pulmonary edema. 2. Stable position of left IJ hemodialysis catheter.   Electronically Signed   By: Jacqulynn Cadet M.D.   On: 04/24/2013 07:48   Dg Chest Port 1 View  04/22/2013   CLINICAL DATA:  Left IJ dialysis catheter insertion  EXAM: PORTABLE CHEST - 1 VIEW  COMPARISON:  None.  FINDINGS: Left IJ dialysis catheter tip terminates in the mid SVC level. Normal heart size and vascularity. Lungs remain clear. No effusion or pneumothorax.  IMPRESSION: Left IJ dialysis catheter tip mid SVC level  No acute finding   Electronically Signed   By: Daryll Brod M.D.   On: 04/22/2013 22:14    Microbiology: Recent Results (from the past 240 hour(s))  MRSA PCR SCREENING     Status: None   Collection Time    04/22/13  7:04 PM      Result Value Range Status   MRSA by PCR NEGATIVE  NEGATIVE Final   Comment:            The GeneXpert MRSA Assay (FDA     approved for NASAL specimens     only), is one component of a     comprehensive MRSA  colonization     surveillance program. It is not     intended to diagnose MRSA     infection nor to guide or     monitor treatment for     MRSA infections.  CULTURE, BLOOD (ROUTINE X 2)     Status: None   Collection Time    04/22/13 10:10 PM      Result Value Range Status   Specimen Description BLOOD HEMODIALYSIS CATHETER   Final   Special Requests BOTTLES DRAWN AEROBIC AND ANAEROBIC 10CC EACH   Final   Culture  Setup Time     Final   Value: 04/23/2013 03:39     Performed at [facility]   Culture     Final   Value: NO GROWTH 5 DAYS     Performed at [facility]   Report Status 04/29/2013 FINAL   Final  CULTURE, BLOOD (ROUTINE X 2)     Status: None   Collection Time    04/22/13 10:18 PM      Result Value Range Status   Specimen Description BLOOD LEFT FOREARM   Final   Special Requests BOTTLES DRAWN AEROBIC ONLY 5CC   Final   Culture  Setup Time     Final   Value: 04/23/2013 03:03     Performed at [facility]   Culture     Final   Value: NO GROWTH 5 DAYS     Performed at [facility]   Report Status 04/29/2013 FINAL   Final  URINE CULTURE     Status: None   Collection Time    04/23/13 11:57 AM      Result Value Range Status   Specimen Description URINE, CATHETERIZED   Final   Special Requests NONE   Final   Culture  Setup Time     Final   Value: 04/23/2013 12:45     Performed at [place]     Final   Value: NO GROWTH     Performed at  [facility]   Culture     Final   Value: NO GROWTH     Performed at [facility]   Report Status 04/24/2013 FINAL   Final     Labs: Basic Metabolic Panel:  Recent Labs Lab 04/27/13 0400  04/28/13 0500 04/28/13 1028 04/29/13 0500 04/30/13 0440 05/01/13 0413  NA 151*  < > 152* 150* 147 145 144  K 4.0  < > 3.2* 3.0* 2.9* 3.4* 3.6*  CL 110  < > 108 102 104 106 108  CO2 26  --  30  --  28 21 21   GLUCOSE 117*  < > 89 107* 86 81 78  BUN 21  < > 17 16 13  11 12   CREATININE 1.83*  < > 1.87* 1.90* 1.76* 1.54* 1.52*  CALCIUM 8.5  --  8.7  --  8.5 8.1* 8.2*  MG  --   --   --   --  1.2*  --   --   PHOS 3.9  --  3.9  --  4.2 2.8 3.3  < > = values in this interval not displayed. Liver Function Tests:  Recent Labs Lab 04/25/13 0440  04/27/13 0400 04/28/13 0500 04/29/13 0500 04/30/13 0440 05/01/13 0413  AST 39*  --   --   --   --   --  28  ALT 21  --   --   --   --   --  22  ALKPHOS 65  --   --   --   --   --  65  BILITOT 0.7  --   --   --   --   --  0.5  PROT 5.9*  --   --   --   --   --  6.5  ALBUMIN 3.4*  < > 3.5 3.5 3.4* 3.6 3.4*  3.4*  < > = values in this interval not displayed. No results found for this basename: LIPASE, AMYLASE,  in the last 168 hours No results found for this basename: AMMONIA,  in the last 168 hours CBC:  Recent Labs Lab 04/28/13 1910 04/29/13 0400 04/29/13 1600 04/30/13 0440 05/01/13 0413  WBC 6.0 5.8 5.9 7.1 7.6  HGB 7.8* 8.2* 7.2* 8.2* 8.6*  HCT 24.1* 24.3* 21.7* 24.9* 25.9*  MCV 95.6 94.9 94.3 95.0 94.5  PLT 185 184 176 214 235   Cardiac Enzymes:  Recent Labs Lab 04/25/13 1010 04/25/13 1610 04/25/13 2135  TROPONINI 0.34* <0.30 <0.30   BNP: BNP (last 3 results) No results found for this basename: PROBNP,  in the last 8760 hours CBG:  Recent Labs Lab 04/24/13 1547 04/24/13 1914 04/24/13 2359 04/28/13 1220  GLUCAP 180* 171* 106* 94       Signed:  Kelse Ploch  [facility] 05/01/2013, 2:26 PM

## 2013-05-01 NOTE — H&P (View-Only) (Signed)
Stroke Team Progress Note  HISTORY Kaitlyn Valdez is an 53 y.o. female with history of stroke that affected her RIGHT side per family member who are at bedside. Daughter states she had been talking to her on the phone between the hours of 9-10 AM and she was speaking normally. Her aunt found patient around 38 AM 04/21/2013 down at home, and per note she was confused, showed slurred speech and lever left sided neglect. Family at bedside state the left sided neglect is new. Patient was transferred to [facility] hospital from [facility] due to PLT count 22 and concern for TTP. TPE was initiated and patient was brought to [facility] hospital. Neurology was consulted concerning possibility of adding Plavix to TPE for embolic prevention.  Patient was not administerd TPA secondary to PLT count of 10. She was admitted to the ICU for further evaluation and treatment.  SUBJECTIVE Family at bedside along with rehab admission coordinator. Planned catheter insertion today. Patient up in bed, talking.  OBJECTIVE Most recent Vital Signs: Filed Vitals:   04/29/13 0612 04/29/13 1400 04/29/13 2109 04/30/13 0433  BP: 150/60 143/79 152/86 144/78  Pulse: 85 62 71 59  Temp: 98.9 F (37.2 C) 98.9 F (37.2 C) 98 F (36.7 C) 99.1 F (37.3 C)  TempSrc: Oral Oral Oral Oral  Resp: 18 18 18 18   Height:      Weight:      SpO2: 95% 95% 95% 98%   CBG (last 3)   Recent Labs  04/28/13 1220  GLUCAP 94    IV Fluid Intake:   . sodium chloride    . citrate dextrose    . citrate dextrose      MEDICATIONS  . calcium gluconate IVPB  4 g Intravenous Once  . calcium gluconate  2 g Intravenous Once  . feeding supplement (ENSURE)  1 Container Oral TID BM  . pantoprazole  40 mg Oral Daily  . simvastatin  20 mg Oral q1800   PRN:  acetaminophen, acetaminophen, diphenhydrAMINE, diphenhydrAMINE, ondansetron (ZOFRAN) IV, RESOURCE THICKENUP CLEAR, sodium chloride  Diet:  NPO  Activity:  OOB DVT Prophylaxis:  SCDs   CLINICALLY  SIGNIFICANT STUDIES Basic Metabolic Panel:   Recent Labs Lab 04/29/13 0500 04/30/13 0440  NA 147 145  K 2.9* 3.4*  CL 104 106  CO2 28 21  GLUCOSE 86 81  BUN 13 11  CREATININE 1.76* 1.54*  CALCIUM 8.5 8.1*  MG 1.2*  --   PHOS 4.2 2.8   Liver Function Tests:  Recent Labs Lab 04/25/13 0440  04/29/13 0500 04/30/13 0440  AST 39*  --   --   --   ALT 21  --   --   --   ALKPHOS 65  --   --   --   BILITOT 0.7  --   --   --   PROT 5.9*  --   --   --   ALBUMIN 3.4*  < > 3.4* 3.6  < > = values in this interval not displayed. CBC:   Recent Labs Lab 04/29/13 1600 04/30/13 0440  WBC 5.9 7.1  HGB 7.2* 8.2*  HCT 21.7* 24.9*  MCV 94.3 95.0  PLT 176 214   Coagulation:  No results found for this basename: LABPROT, INR,  in the last 168 hours Cardiac Enzymes:   Recent Labs Lab 04/25/13 1010 04/25/13 1610 04/25/13 2135  TROPONINI 0.34* <0.30 <0.30   Urinalysis:   Recent Labs Lab 04/26/13 1057 04/26/13 1443  COLORURINE YELLOW  YELLOW  LABSPEC 1.017 1.015  PHURINE 8.5* 8.5*  GLUCOSEU NEGATIVE NEGATIVE  HGBUR SMALL* SMALL*  BILIRUBINUR NEGATIVE NEGATIVE  KETONESUR NEGATIVE NEGATIVE  PROTEINUR 30* NEGATIVE  UROBILINOGEN 1.0 1.0  NITRITE NEGATIVE NEGATIVE  LEUKOCYTESUR NEGATIVE NEGATIVE   Lipid Panel    Component Value Date/Time   CHOL 122 04/24/2013 0313   TRIG 99 04/24/2013 0313   HDL 37* 04/24/2013 0313   CHOLHDL 3.3 04/24/2013 0313   VLDL 20 04/24/2013 0313   LDLCALC 65 04/24/2013 0313   HgbA1C  Lab Results  Component Value Date   HGBA1C 4.8 04/23/2013    Urine Drug Screen:   No results found for this basename: labopia,  cocainscrnur,  labbenz,  amphetmu,  thcu,  labbarb    Alcohol Level: No results found for this basename: ETH,  in the last 168 hours  No results found.  CT of the brain  04/26/2013  1. Further evolution of the large right middle cerebral artery distribution stroke, with minimal petechial hemorrhage at the anterior extent of the infarct  in the posterior left frontal lobe as a result of luxury perfusion. No significant intracranial hemorrhage. 2. Mass effect with slight shift of the midline to the left approximating 2-3 mm. No evidence of transtentorial or uncal herniation. 3. No new intracranial abnormality elsewhere. 04/23/2013 Exam is motion degraded. Large nonhemorrhagic acute right middle cerebral artery distribution infarct with slight compression of the right lateral ventricle without midline shift. Patient is at risk for development of further mass effect/hemorrhage as the infarct evolves. Remote infarct with encephalomalacia left posterior temporal -parietal lobe.   2D Echocardiogram  EF 60-65% with no source of embolus.   Carotid Doppler  Bilateral: 1-39% ICA stenosis. Vertebral artery flow is antegrade.   TCD This was a abnormal transcranial Doppler study, with low right middle cerebral artery mean flow velocities suggesting distal stenosis and poor left temporal and occipital windows limiting evaluation..   TEE  CXR   04/27/2013 No acute cardiopulmonary disease.  04/24/2013 1. Slightly increased pulmonary vascular congestion without evidence of pulmonary edema. 2. Stable position of left IJ hemodialysis catheter.  04/22/2013 Left IJ dialysis catheter tip mid SVC level No acute finding   EKG  Sinus bradycardia with short PR. Nonspecific ST abnormality  Therapy Recommendations  CIR  Physical Exam   Elderly caucasian lady not in distress. Afebrile. Head is nontraumatic. Neck is supple without bruit. Cardiac exam no murmur or gallop. Lungs are clear to auscultation. Distal pulses are well felt.  Neurological Exam : Awake alert. Right gaze preference but can look up to the left till midline. Does not blink to threat on the left but does so on the right. Pupils 4 mm irregular but sluggishly reactive. Fundi were not visualized. Speech is dysarthric and she is disoriented and confused and distractible. Follows simple one-step  commands only. Tongue is midline. Left lower facial weakness. Left hemiparesis but will withdraw left upper and lower extremity to painful stimuli. Purposeful movements and right side against gravity. Left plantar is upgoing right is downgoing. Diminished sensation on the left and significant left hemineglect. Gait was not tested.   ASSESSMENT Kaitlyn Valdez is a 53 y.o. female presenting with confusion, slurred speech, and left sided neglect. Imaging confirms a right wedge shaped MCA infarct. Infarct felt to be embolic given 3 prior infarcts in the left brain There is no documented large vessel disease per old records. New large vessel infarcts unlikely to be associated with TTP which typically  causes microangiopathy, especially no associated with recurrent strokes. Most likely differentials include cardioembolic source, less likely primary hypercoagulable source. On clopidogrel 75 mg orally every day prior to admission. Now on no antithrombotics for secondary stroke prevention. Patient with resultant confusion (improved), dysarthria, neurologic neglect, dysphagia (improved), left arm hemiparesis and field cut. Work up underway.  Reviewed old records from Sept 12, 2011,  neurologist Karren CobbleMustafa Hammad[place] [facility] [place] Had cerebral angio at that time which showed no large vessel occlusion or stenosis. MRI showed L subacute occipital infarct in setting of old infarcts L frontal, L occipital, R parietal with volume loss. Sept 2011-April 2012 PLT range 102-197 at that time.  Hx congenital heart disease with surgical repair.   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), undergoing plasmaphoresis, Platelets improved 10 to 168  Severe anemia, Stable Hb at 8.4   Acute Renal failure, Cr 1.76  Induced hypernatremia in order to decrease cerebral edema, protocol stopped 04/24/2013, Na drifting down,  Na 150 this am  Hypertension   Hyperlipidemia, LDL 65, on pravachol 40 mg  daily PTA, now on no statin as NPO, goal LDL < 100 (< 70 for diabetics)   Hx strokes - baseline L HP per family suggests old right brain stroke; old left brain strokes seen on imaging   Depression  Hx hepatitis C  Hospital day # 8  TREATMENT/PLAN  No antithrombotics at this time for secondary stroke prevention due to TTP  TEE to look for embolic source. Arranged with [facility] for today.  If positive for PFO (patent foramen ovale), check bilateral lower extremity venous dopplers to rule out DVT as possible source of stroke. Will discuss possibility of a loop recorder with cardiologist if TEE unrevealing. Consider hypercoagulable labs in the future if no stroke source found Rehab following for disposition.  Annie MainSHARON BIBY, MSN, RN, ANVP-BC, ANP-BC, Lawernce IonGNP-BC [facility] Pager: [phone] 04/30/2013 10:10 AM  I have personally obtained a history, examined the patient, evaluated imaging results, and formulated the assessment and plan of care. I agree with the above.  Delia HeadyPramod Tenea Sens, MD

## 2013-05-01 NOTE — H&P (View-Only) (Signed)
[facility] NOTE   MRN : [id number]  Reason for Consult: Diatek catheter for plasmapheresis.  Referring Physician: Artis Delay   History of Present Illness: This is a 53 y/o female She was admitted secondary new onset CVA left sided neglect.  Past medical history includes hypertension, hypercholesterolemia and TTP.  We have been ask to provide a diatek catheter for daily plasmapheresis.  She currently takes pravastatin and plavix.       Current Facility-Administered Medications  Medication Dose Route Frequency Provider Last Rate Last Dose  . acetaminophen (TYLENOL) tablet 650 mg  650 mg Oral Q4H PRN [name], MD      . acetaminophen (TYLENOL) tablet 650 mg  650 mg Oral Q4H PRN [name], MD      . calcium gluconate 4 g in sodium chloride 0.9 % 250 mL IVPB  4 g Intravenous Once [name], MD      . calcium gluconate inj 10% (1 g) URGENT USE ONLY!  2 g Intravenous Once [name], MD      . citrate dextrose (ACD-A anticoagulant) solution 500 mL  500 mL Intravenous Continuous [name], MD   500 mL at 04/25/13 1445  . citrate dextrose (ACD-A anticoagulant) solution 500 mL  500 mL Intravenous Continuous [name], MD   500 mL at 04/27/13 1533  . diphenhydrAMINE (BENADRYL) capsule 25 mg  25 mg Oral Q6H PRN [name], MD      . diphenhydrAMINE (BENADRYL) injection 25 mg  25 mg Intravenous Q6H PRN Lonia Farber, MD   25 mg at 04/27/13 2122  . feeding supplement (ENSURE) (ENSURE) pudding 1 Container  1 Container Oral TID BM Hettie Holstein, RD   1 Container at 04/28/13 2000  . haloperidol lactate (HALDOL) injection 2 mg  2 mg Intravenous Q4H PRN Courtney Paris, MD   2 mg at 04/27/13 2122  . ondansetron (ZOFRAN) injection 4 mg  4 mg Intravenous Q8H PRN Courtney Paris, MD   4 mg at 04/22/13 2239  . pantoprazole (PROTONIX) EC tablet 40 mg  40 mg Oral Daily Henderson Cloud, MD   40 mg at 04/28/13 2000  .  potassium chloride SA (K-DUR,KLOR-CON) CR tablet 40 mEq  40 mEq Oral Q4H Vassie Loll, MD   40 mEq at 04/29/13 0943  . RESOURCE THICKENUP CLEAR   Oral PRN Nelda Bucks, MD      . simvastatin (ZOCOR) tablet 20 mg  20 mg Oral q1800 David L Rinehuls, PA-C   20 mg at 04/28/13 1720    Pt meds include: Statin :Yes Betablocker: No ASA: No Other anticoagulants/antiplatelets: Plavix  Past Medical History  Diagnosis Date  . CVA (cerebral vascular accident)   . Depression   . Hypertension   . Hypercholesteremia   . TTP (thrombotic thrombopenic purpura)     No past surgical history on file.  Social History History  Substance Use Topics  . Smoking status: Not on file  . Smokeless tobacco: Not on file  . Alcohol Use: Not on file    Family History Family History  Problem Relation Age of Onset  . Hypertension Mother   . Hyperlipidemia Mother   . Hypertension Father     Allergies  Allergen Reactions  . Penicillins Anaphylaxis     REVIEW OF SYSTEMS  General: [ ]  Weight loss, [ ]  Fever, [ ]  chills Neurologic: [ ]  Dizziness, [ ]  Blackouts, [ ]   Seizure [x ] Stroke, [ ]  "Mini stroke", [x ] Slurred speech, [ ]  Temporary blindness; [ ]  weakness in arms or legs, [ ]  Hoarseness [ ]  Dysphagia Cardiac: [ ]  Chest pain/pressure, [ ]  Shortness of breath at rest [ ]  Shortness of breath with exertion, [ ]  Atrial fibrillation or irregular heartbeat  Vascular: [ ]  Pain in legs with walking, [ ]  Pain in legs at rest, [ ]  Pain in legs at night,  [ ]  Non-healing ulcer, [ ]  Blood clot in vein/DVT,   Pulmonary: [ ]  Home oxygen, [ ]  Productive cough, [ ]  Coughing up blood, [ ]  Asthma,  [ ]  Wheezing [ ]  COPD Musculoskeletal:  [ ]  Arthritis, [ ]  Low back pain, [ ]  Joint pain Hematologic: [ ]  Easy Bruising, [ ]  Anemia; [ ]  Hepatitis [x]  TTP Gastrointestinal: [ ]  Blood in stool, [ ]  Gastroesophageal Reflux/heartburn, Urinary: [ ]  chronic Kidney disease, [ ]  on HD - [ ]  MWF or [ ]  TTHS, [ ]  Burning  with urination, [ ]  Difficulty urinating Skin: [ ]  Rashes, [ ]  Wounds Psychological: [ ]  Anxiety, [x ] Depression  Physical Examination Filed Vitals:   04/28/13 1605 04/28/13 2000 04/29/13 0534 04/29/13 0612  BP: 143/84 155/89  150/60  Pulse: 81 77  85  Temp: 98.9 F (37.2 C) 100.1 F (37.8 C)  98.9 F (37.2 C)  TempSrc: Oral Oral  Oral  Resp: 18 20  18   Height:      Weight:   146 lb 13.2 oz (66.6 kg)   SpO2: 92% 94%  95%   Body mass index is 24.43 kg/(m^2).  General:  WDWN in NAD Alert but unable to communicate person or time, she does know place Gait: unkowen HENT: WNL Eyes: Pupils equal Pulmonary: normal non-labored breathing , without Rales, rhonchi,  wheezing Cardiac: RRR, without  Murmurs, rubs or gallops; No carotid bruits Abdomen: soft, NT, no masses Skin: no rashes, ulcers noted;  no Gangrene , no cellulitis; no open wounds;   Vascular Exam/Pulses:Palpable radial, brachial, femoral DP/PT   Musculoskeletal: no muscle wasting or atrophy; no edema Left sided neglect left arm sever flacid , she does move the left leg.   Neurologic: follows simple commands  SENSATION: normal; with right facial droop MOTOR FUNCTION: 5/5 Symmetric right side upper and lower , Left upper arm flaccid.  Will move left leg Speech is fluent/normal   Significant Diagnostic Studies: CBC Lab Results  Component Value Date   WBC 5.8 04/29/2013   HGB 8.2* 04/29/2013   HCT 24.3* 04/29/2013   MCV 94.9 04/29/2013   PLT 184 04/29/2013    BMET    Component Value Date/Time   NA 147 04/29/2013 0500   K 2.9* 04/29/2013 0500   CL 104 04/29/2013 0500   CO2 28 04/29/2013 0500   GLUCOSE 86 04/29/2013 0500   BUN 13 04/29/2013 0500   CREATININE 1.76* 04/29/2013 0500   CALCIUM 8.5 04/29/2013 0500   GFRNONAA 32* 04/29/2013 0500   GFRAA 37* 04/29/2013 0500   Estimated Creatinine Clearance: 32.9 ml/min (by C-G formula based on Cr of 1.76).  COAG Lab Results  Component Value Date   INR 1.11 04/22/2013   CT head:   Large nonhemorrhagic acute right middle cerebral artery distribution  infarct with slight compression of the right lateral ventricle  without midline shift. Patient is at risk for development of further  mass effect/hemorrhage as the infarct evolves.   Remote infarct with encephalomalacia left posterior temporal  -parietal  lobe.    Non-Invasive Vascular Imaging:  Carotid duplex negative for stenosis Bilateral - 1% to 39% ICA stenosis lowest end of scale. Vertebral aretery flow is antegrade.    ASSESSMENT/PLAN:  Right CVA TTP with need for diatek catheter for out patient Plasmapheresis. The patient is unable to sign for her self, I have asked nursing to find out who her power of attorney is.  Clinton GallantCOLLINS, EMMA [facility] 04/29/2013 11:11 AM  I have examined the patient, reviewed and agree with above. Patient requires cuffed catheter for plasmapheresis. Attempted to explain the patient's very poor understanding with the new major stroke. We'll obtain consent from family. Can place a new catheter at any time. Will plan placement following consent.   Madden Piazza, MD 04/29/2013 3:05 PM

## 2013-05-01 NOTE — Anesthesia Postprocedure Evaluation (Signed)
  Anesthesia Post-op Note  Patient: Kaitlyn Valdez  Procedure(s) Performed: Procedure(s): INSERTION OF DIALYSIS CATHETER; ULTRASOUND GUIDED (N/A)  Patient Location: PACU  Anesthesia Type:MAC  Level of Consciousness: awake, alert , oriented and patient cooperative  Airway and Oxygen Therapy: Patient Spontanous Breathing and Patient connected to nasal cannula oxygen  Post-op Pain: none  Post-op Assessment: Post-op Vital signs reviewed, Patient's Cardiovascular Status Stable, Respiratory Function Stable, Patent Airway, No signs of Nausea or vomiting and Pain level controlled  Post-op Vital Signs: Reviewed and stable  Complications: No apparent anesthesia complications

## 2013-05-01 NOTE — Progress Notes (Signed)
Pt family called and made aware of dc instructions and education. All medications reviewed with family. Report to be called to receiving facility. Pt stable at this time. Heart monitor cleaned and returned to front. Pt being d/ced to SNF with foley in place. Levonne Spillerhasidy Saide Lanuza, RN

## 2013-05-01 NOTE — Telephone Encounter (Signed)
, °

## 2013-05-03 ENCOUNTER — Telehealth: Payer: Self-pay | Admitting: Hematology and Oncology

## 2013-05-03 ENCOUNTER — Encounter: Payer: Self-pay | Admitting: Hematology and Oncology

## 2013-05-03 ENCOUNTER — Other Ambulatory Visit: Payer: Self-pay | Admitting: Hematology and Oncology

## 2013-05-03 ENCOUNTER — Ambulatory Visit (HOSPITAL_BASED_OUTPATIENT_CLINIC_OR_DEPARTMENT_OTHER): Payer: Commercial Managed Care - HMO | Admitting: Hematology and Oncology

## 2013-05-03 ENCOUNTER — Other Ambulatory Visit (HOSPITAL_BASED_OUTPATIENT_CLINIC_OR_DEPARTMENT_OTHER): Payer: Medicare HMO

## 2013-05-03 VITALS — BP 169/95 | HR 72 | Temp 97.0°F | Resp 18 | Ht 65.0 in

## 2013-05-03 DIAGNOSIS — M311 Thrombotic microangiopathy, unspecified: Secondary | ICD-10-CM

## 2013-05-03 DIAGNOSIS — D638 Anemia in other chronic diseases classified elsewhere: Secondary | ICD-10-CM

## 2013-05-03 DIAGNOSIS — Z8673 Personal history of transient ischemic attack (TIA), and cerebral infarction without residual deficits: Secondary | ICD-10-CM

## 2013-05-03 DIAGNOSIS — L0291 Cutaneous abscess, unspecified: Secondary | ICD-10-CM

## 2013-05-03 DIAGNOSIS — L0390 Cellulitis, unspecified: Secondary | ICD-10-CM

## 2013-05-03 DIAGNOSIS — M3119 Other thrombotic microangiopathy: Secondary | ICD-10-CM

## 2013-05-03 DIAGNOSIS — G9340 Encephalopathy, unspecified: Secondary | ICD-10-CM

## 2013-05-03 DIAGNOSIS — D649 Anemia, unspecified: Secondary | ICD-10-CM

## 2013-05-03 DIAGNOSIS — N179 Acute kidney failure, unspecified: Secondary | ICD-10-CM

## 2013-05-03 HISTORY — DX: Anemia in other chronic diseases classified elsewhere: D63.8

## 2013-05-03 LAB — COMPREHENSIVE METABOLIC PANEL (CC13)
ALT: 42 U/L (ref 0–55)
ANION GAP: 14 meq/L — AB (ref 3–11)
AST: 62 U/L — ABNORMAL HIGH (ref 5–34)
Albumin: 4.1 g/dL (ref 3.5–5.0)
Alkaline Phosphatase: 77 U/L (ref 40–150)
BUN: 14.3 mg/dL (ref 7.0–26.0)
CALCIUM: 9.6 mg/dL (ref 8.4–10.4)
CHLORIDE: 108 meq/L (ref 98–109)
CO2: 22 meq/L (ref 22–29)
CREATININE: 1.2 mg/dL — AB (ref 0.6–1.1)
GLUCOSE: 79 mg/dL (ref 70–140)
Potassium: 3.3 mEq/L — ABNORMAL LOW (ref 3.5–5.1)
Sodium: 143 mEq/L (ref 136–145)
Total Bilirubin: 1.16 mg/dL (ref 0.20–1.20)
Total Protein: 7.4 g/dL (ref 6.4–8.3)

## 2013-05-03 LAB — CBC & DIFF AND RETIC
BASO%: 0.1 % (ref 0.0–2.0)
Basophils Absolute: 0 10e3/uL (ref 0.0–0.1)
EOS%: 1.3 % (ref 0.0–7.0)
Eosinophils Absolute: 0.1 10e3/uL (ref 0.0–0.5)
HCT: 30.1 % — ABNORMAL LOW (ref 34.8–46.6)
HGB: 9.9 g/dL — ABNORMAL LOW (ref 11.6–15.9)
Immature Retic Fract: 14.5 % — ABNORMAL HIGH (ref 1.60–10.00)
LYMPH%: 20.3 % (ref 14.0–49.7)
MCH: 30.9 pg (ref 25.1–34.0)
MCHC: 32.9 g/dL (ref 31.5–36.0)
MCV: 94.1 fL (ref 79.5–101.0)
MONO#: 0.8 10e3/uL (ref 0.1–0.9)
MONO%: 10 % (ref 0.0–14.0)
NEUT#: 5.4 10e3/uL (ref 1.5–6.5)
NEUT%: 68.3 % (ref 38.4–76.8)
Platelets: 184 10e3/uL (ref 145–400)
RBC: 3.2 10e6/uL — ABNORMAL LOW (ref 3.70–5.45)
RDW: 16.2 % — ABNORMAL HIGH (ref 11.2–14.5)
Retic %: 3.45 % — ABNORMAL HIGH (ref 0.70–2.10)
Retic Ct Abs: 110.4 10e3/uL — ABNORMAL HIGH (ref 33.70–90.70)
WBC: 7.8 10e3/uL (ref 3.9–10.3)
lymph#: 1.6 10e3/uL (ref 0.9–3.3)

## 2013-05-03 LAB — CHCC SMEAR

## 2013-05-03 NOTE — Telephone Encounter (Signed)
Gave pt appt for lab and Md for february 2015 °

## 2013-05-03 NOTE — Progress Notes (Signed)
[facility] OFFICE PROGRESS NOTE  Valdez,Kaitlyn M, MD DIAGNOSIS:  Recurrent TTP  SUMMARY OF HEMATOLOGIC HISTORY: This is a pleasant 53 year old lady that was originally seen in the hospital. She had diagnosis of TTP many years ago and was lost to followup. [facility]. From 04/22/2013 to 05/01/2013, she was admitted to the hospital due to progressive neurological deficit. The patient presented with classic symptoms of low-grade fever, acute renal failure, hemolytic anemia, severe thrombocytopenia and neurological deficit. ADAMTS13 activity was low at 12%. She received plasmapheresis from 04/23/2013 to 04/28/2013. She has recovery of her kidney function and her platelet count. The low-grade temperature resolved. Anemia was improving by the time of dismissal. She still have persistent neurological deficit at the time of discharge and she was moved to a skilled nursing facility after dismissal. A permanent dialysis catheter was placed on the right side of the neck to facilitate possible repeat treatment of TTP INTERVAL HISTORY: Kaitlyn Valdez 53 y.o. female returns for further followup. History is difficult to obtain as the patient has severe dysarthria. She appears to be oriented to person but not to place and time which is not worse from her background. There were no noticeable new neurological deficit.  I have reviewed the past medical history, past surgical history, social history and family history with the patient and they are unchanged from previous note.  ALLERGIES:  is allergic to penicillins.  MEDICATIONS:  Current Outpatient Prescriptions  Medication Sig Dispense Refill  . acetaminophen (TYLENOL) 500 MG tablet Take 500 mg by mouth every 6 (six) hours as needed for headache.      Marland Kitchen. aspirin 81 MG chewable tablet Chew 1 tablet (81 mg total) by mouth daily.  30 tablet  0  . clonazePAM (KLONOPIN) 1 MG tablet Take 1 mg by mouth at  bedtime.       . feeding supplement, ENSURE, (ENSURE) PUDG Take 1 Container by mouth 3 (three) times daily between meals.  30 Can  0  . fluticasone (FLONASE) 50 MCG/ACT nasal spray Place 2 sprays into both nostrils daily.      . folic acid (FOLVITE) 0.5 MG tablet Take 0.5 tablets (0.5 mg total) by mouth daily.  30 tablet  0  . LORazepam (ATIVAN) 0.5 MG tablet Take 0.5 mg by mouth every 8 (eight) hours as needed for anxiety.      . pantoprazole (PROTONIX) 40 MG tablet Take 1 tablet (40 mg total) by mouth daily.  30 tablet  0  . pravastatin (PRAVACHOL) 40 MG tablet Take 40 mg by mouth daily.      . Vortioxetine HBr (BRINTELLIX) 10 MG TABS Take 20 mg by mouth daily.       No current facility-administered medications for this visit.     REVIEW OF SYSTEMS:  Unable to assess due to her dysarthria and neurological deficit   PHYSICAL EXAMINATION: ECOG PERFORMANCE STATUS: 2 - Symptomatic, <50% confined to bed  Filed Vitals:   05/03/13 1342  BP: 169/95  Pulse: 72  Temp: 97 F (36.1 C)  Resp: 18   Filed Weights    GENERAL:alert, no distress and comfortable SKIN: skin color, texture, turgor are normal, with new rash on the left lower extremity resampling cellulitis  EYES: normal, Conjunctiva are pink and non-injected, sclera clear OROPHARYNX:no exudate, no erythema and lips, buccal mucosa, and tongue normal  NECK: supple, thyroid normal size, non-tender, without nodularity. Dialysis catheter in situ with no signs of  infection LYMPH:  no palpable lymphadenopathy in the cervical, axillary or inguinal LUNGS: clear to auscultation and percussion with normal breathing effort HEART: regular rate & rhythm and no murmurs and no lower extremity edema ABDOMEN:abdomen soft, non-tender and normal bowel sounds Musculoskeletal:no cyanosis of digits and no clubbing  NEURO: alert but not oriented with dysarthria, and persistent left-sided neurological deficit LABORATORY DATA:  I have reviewed the data  as listed Results for orders placed in visit on 05/03/13 (from the past 48 hour(s))  CBC & DIFF AND RETIC     Status: Abnormal   Collection Time    05/03/13  1:33 PM      Result Value Range   WBC 7.8  3.9 - 10.3 10e3/uL   NEUT# 5.4  1.5 - 6.5 10e3/uL   HGB 9.9 (*) 11.6 - 15.9 g/dL   HCT 96.0 (*) 45.4 - 09.8 %   Platelets 184  145 - 400 10e3/uL   MCV 94.1  79.5 - 101.0 fL   MCH 30.9  25.1 - 34.0 pg   MCHC 32.9  31.5 - 36.0 g/dL   RBC 1.19 (*) 1.47 - 8.29 10e6/uL   RDW 16.2 (*) 11.2 - 14.5 %   lymph# 1.6  0.9 - 3.3 10e3/uL   MONO# 0.8  0.1 - 0.9 10e3/uL   Eosinophils Absolute 0.1  0.0 - 0.5 10e3/uL   Basophils Absolute 0.0  0.0 - 0.1 10e3/uL   NEUT% 68.3  38.4 - 76.8 %   LYMPH% 20.3  14.0 - 49.7 %   MONO% 10.0  0.0 - 14.0 %   EOS% 1.3  0.0 - 7.0 %   BASO% 0.1  0.0 - 2.0 %   Retic % 3.45 (*) 0.70 - 2.10 %   Retic Ct Abs 110.40 (*) 33.70 - 90.70 10e3/uL   Immature Retic Fract 14.50 (*) 1.60 - 10.00 %  [facility] SMEAR     Status: None   Collection Time    05/03/13  1:33 PM      Result Value Range   Smear Result Smear Available      Lab Results  Component Value Date   WBC 7.8 05/03/2013   HGB 9.9* 05/03/2013   HCT 30.1* 05/03/2013   MCV 94.1 05/03/2013   PLT 184 05/03/2013   I review her peripheral smear. There is abundance of platelets with no clumping. There are occasional schistocytes, 1-2 per high power field  ASSESSMENT & PLAN:  #1 TTP :  s/p Plasmapheresis, last on 04/28/13. Platelets are normal today. No plasmapheresis to be given today. Appreciate VVS placement of vascular cath for outpatient pheresis.  There were only a few schistocytes seen in the peripheral blood smear. Will continue to follow her closely on a weekly basis.  If she does not need possible pheresis for more than a month, I will consult interventional radiologist to remove the catheter #2 anemia  This continue to improve. I will observe. #3 acute renal failure in the setting of TTP  S/P plasmapheresis as above,  improving.  Kidney function is still pending #4 history of stroke  Plavix is not recommended due to risk of TTP with Plavix. She will continue on aspirin  Currently she is in a skilled nursing facility for rehabilitation #5 cellulitis This is new. I recommend starting her on Keflex for one week. All questions were answered. The patient knows to call the clinic with any problems, questions or concerns. No barriers to learning was detected.  I spent 40 minutes  counseling the patient face to face. The total time spent in the appointment was 60 minutes and more than 50% was on counseling.     [facility], Bodee Lafoe, MD 05/03/2013 2:09 PM

## 2013-05-03 NOTE — Progress Notes (Signed)
Pt arrived via transportation from SNF.  She appears pleasantly confused,  Speaking gibberish out at registration desk,  But able to answer simple questions and actually able to give her date of birth after several attempts.  She denies any pain when asked and appears comfortable and in no acute distress.  She not able to answer any other collaborative nursing assessment questions with any real clarity.  Nursing assessment deferred at this time.  Medication list reconciled with MAR from SNF.

## 2013-05-06 ENCOUNTER — Encounter (HOSPITAL_COMMUNITY): Payer: Self-pay | Admitting: Vascular Surgery

## 2013-05-07 ENCOUNTER — Encounter: Payer: Self-pay | Admitting: Gastroenterology

## 2013-05-07 ENCOUNTER — Ambulatory Visit: Payer: Medicare Other | Admitting: Gastroenterology

## 2013-05-07 ENCOUNTER — Telehealth: Payer: Self-pay | Admitting: Gastroenterology

## 2013-05-10 ENCOUNTER — Ambulatory Visit: Payer: Medicare Other | Admitting: Hematology and Oncology

## 2013-05-10 ENCOUNTER — Other Ambulatory Visit: Payer: Medicare Other

## 2013-05-13 ENCOUNTER — Ambulatory Visit: Payer: Medicare Other | Admitting: Hematology and Oncology

## 2013-05-20 ENCOUNTER — Telehealth: Payer: Self-pay | Admitting: *Deleted

## 2013-05-20 NOTE — Telephone Encounter (Signed)
Called pt's sister to f/u on missed visit.  She reports pt died on 21-Mar-2014 apparently of a massive stroke.   Dr. Bertis RuddyGorsuch notified.

## 2013-05-26 NOTE — Telephone Encounter (Signed)
Pt was a no show

## 2013-05-26 NOTE — Telephone Encounter (Signed)
Mailed letter °

## 2013-05-26 DEATH — deceased

## 2014-11-21 IMAGING — CT CT HEAD W/O CM
1 of 2 series · 15 of 30 positions shown, 19 images · non-contrast
Comparison: None.

CLINICAL DATA: 52-year-old female with hypertension and
hypercholesterolemia down down with new left arm and leg weakness
with slurred speech. TTP with platelets of 10.

EXAM:
CT HEAD WITHOUT CONTRAST
TECHNIQUE: Contiguous axial images were obtained from the base of the skull
through the vertex without intravenous contrast.

[Series 2: head 5.0 h30s · axial · 0.45mm/px · z∈[+1270,+1400]mm · 15 of 30 slices shown, 19 images]
[im 2/30  brain]
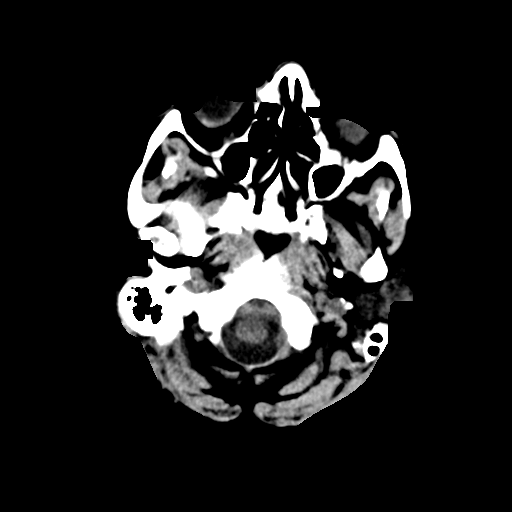
[im 2/30  bone]
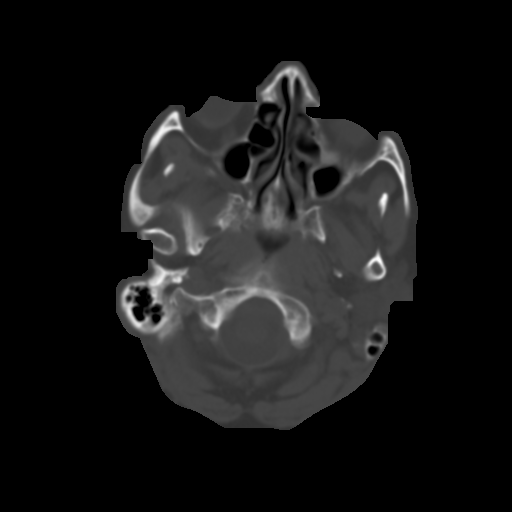
[im 4/30  brain]
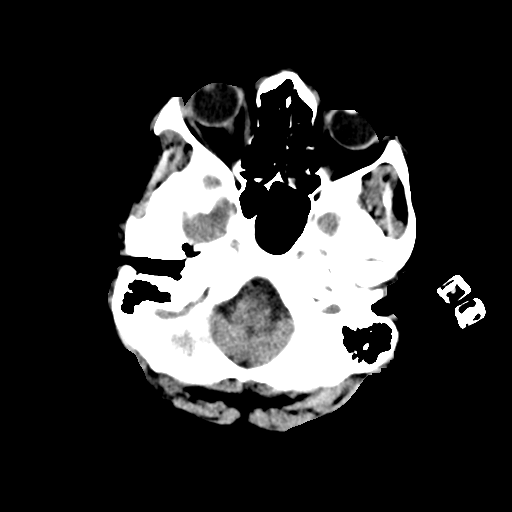
[im 7/30  brain]
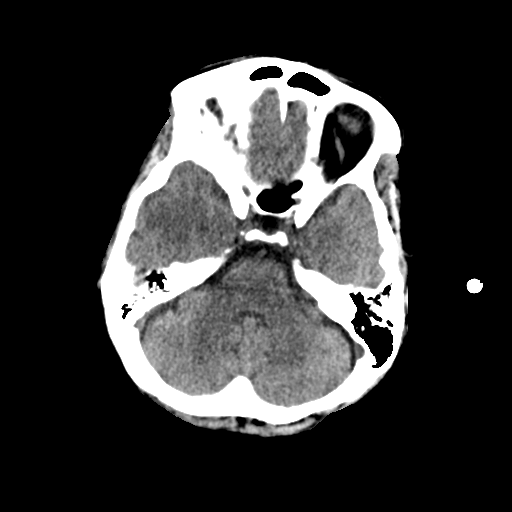
[im 8/30  brain]
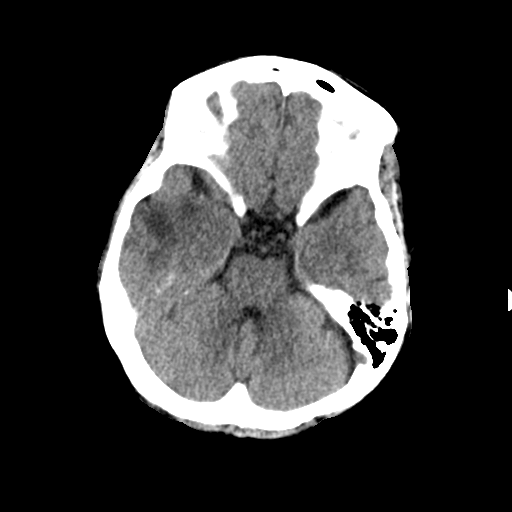
[im 10/30  brain]
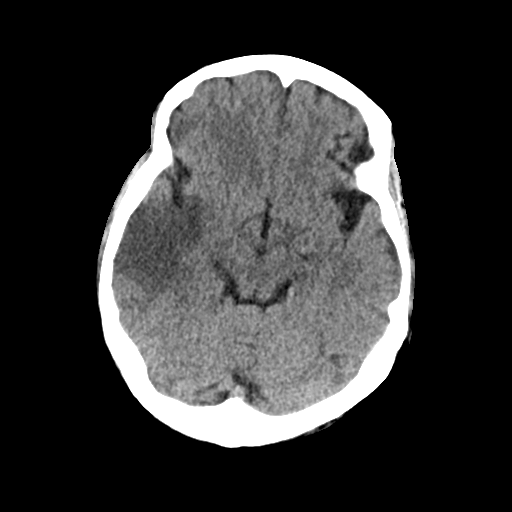
[im 10/30  bone]
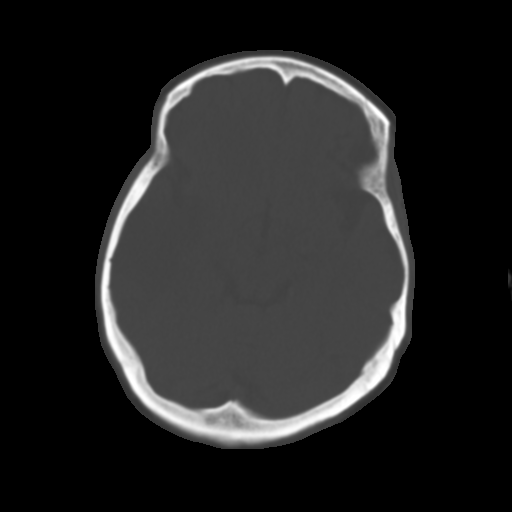
[im 11/30  brain]
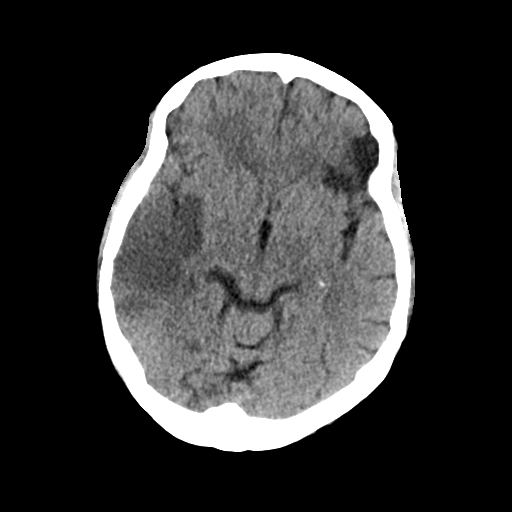
[im 13/30  brain]
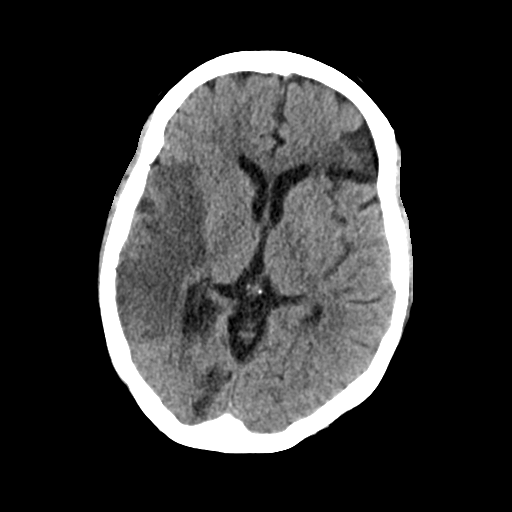
[im 16/30  brain]
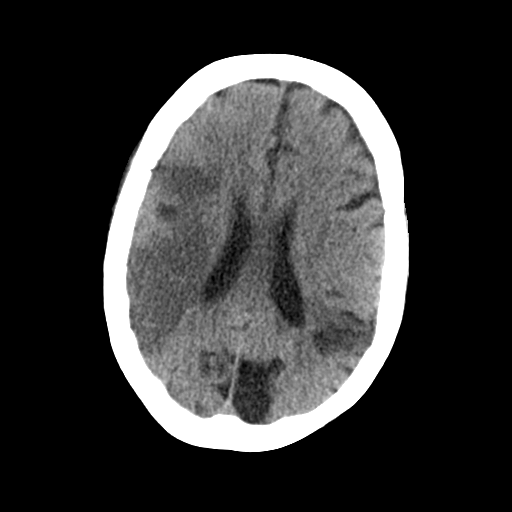
[im 17/30  brain]
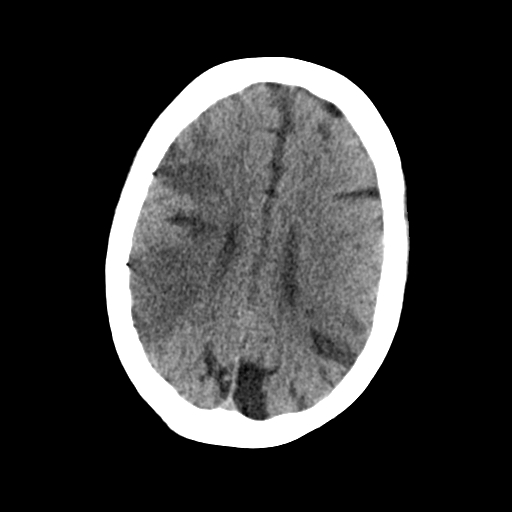
[im 17/30  bone]
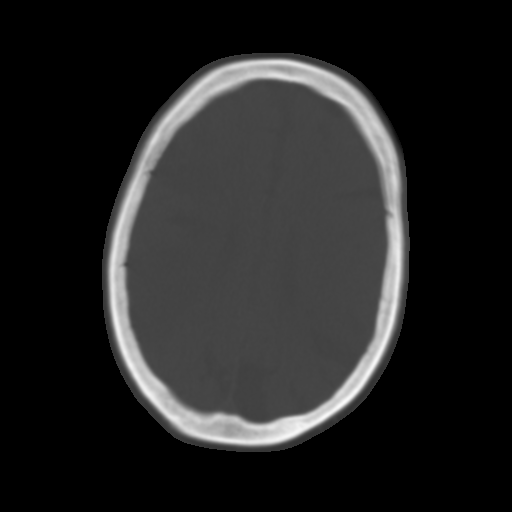
[im 19/30  brain]
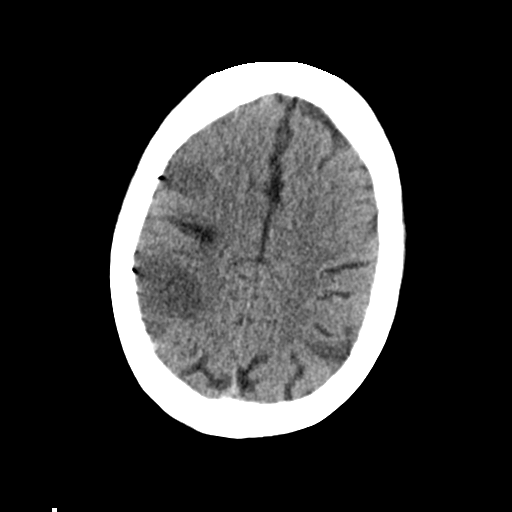
[im 20/30  brain]
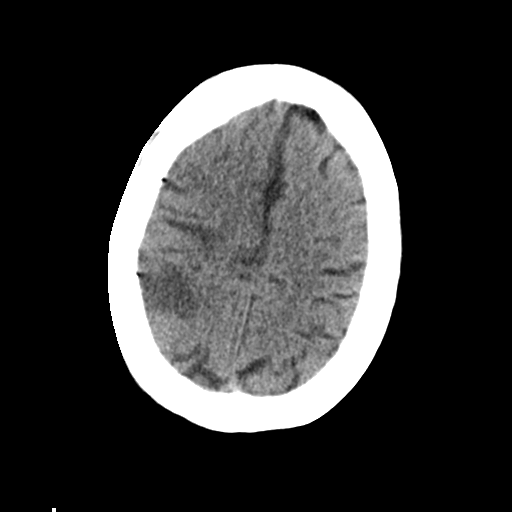
[im 22/30  brain]
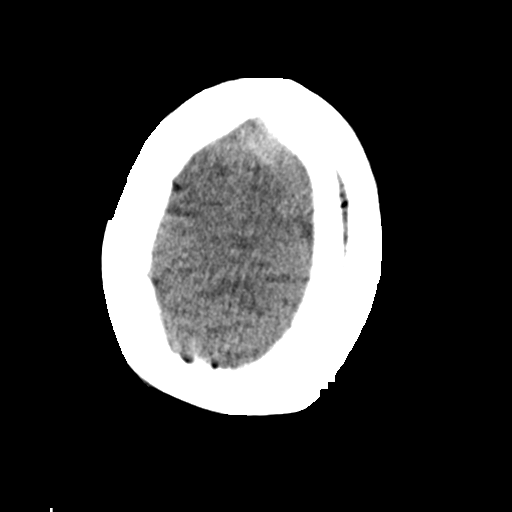
[im 25/30  brain]
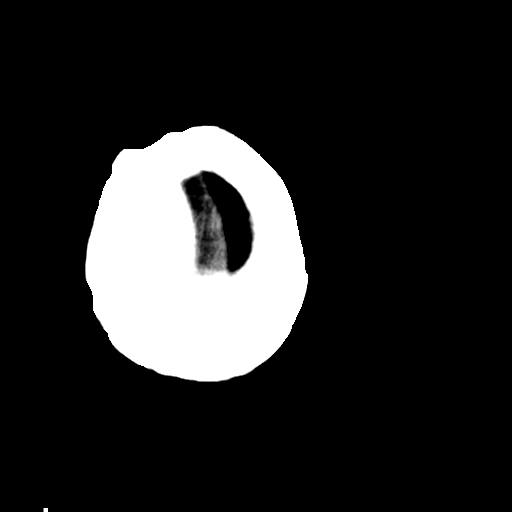
[im 25/30  bone]
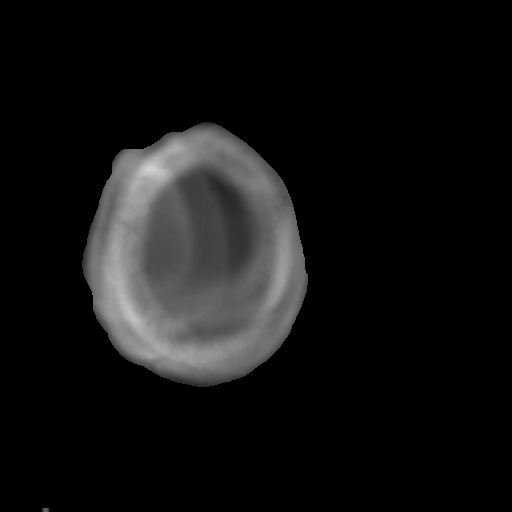
[im 26/30  brain]
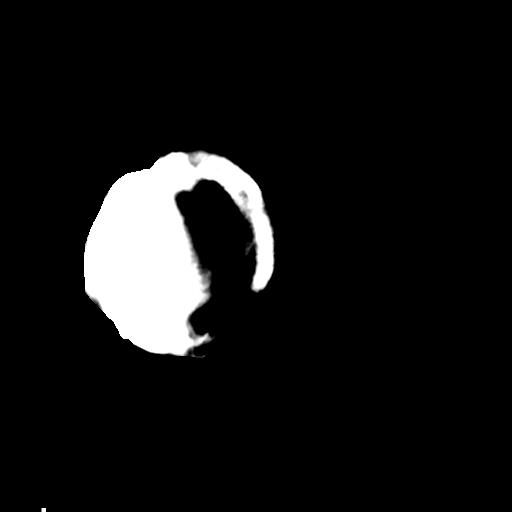
[im 28/30  brain]
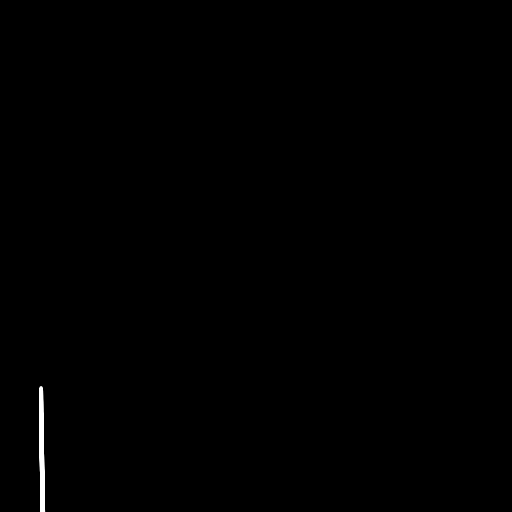

[15 of 30 positions shown; findings below may reference images not displayed]

FINDINGS: Exam is motion degraded.

Large nonhemorrhagic acute right middle cerebral artery distribution
infarct involving portions of the right temporal lobe, right
operculum region, right subinsular region, right frontal lobe and
right parietal lobe. Local mass effect with slight compression of
the right lateral ventricle without midline shift.

Remote infarct with encephalomalacia left posterior temporal
-parietal lobe.

Baseline atrophy without hydrocephalus.

No intracranial mass lesion noted on this unenhanced exam.

Mild mucosal thickening paranasal sinuses.
IMPRESSION: Exam is motion degraded.

Large nonhemorrhagic acute right middle cerebral artery distribution
infarct with slight compression of the right lateral ventricle
without midline shift. Patient is at risk for development of further
mass effect/hemorrhage as the infarct evolves.

Remote infarct with encephalomalacia left posterior temporal
-parietal lobe.

These results were called by telephone at the time of interpretation
on 04/23/2013 at [DATE] to Dr. Jumper who verbally acknowledged
these results.

## 2014-11-22 IMAGING — CR DG CHEST 1V PORT
1 series · 1 of 1 positions shown · non-contrast
Comparison: Prior chest x-ray 04/22/2013

CLINICAL DATA: Respiratory failure

EXAM:
PORTABLE CHEST - 1 VIEW

[AP]
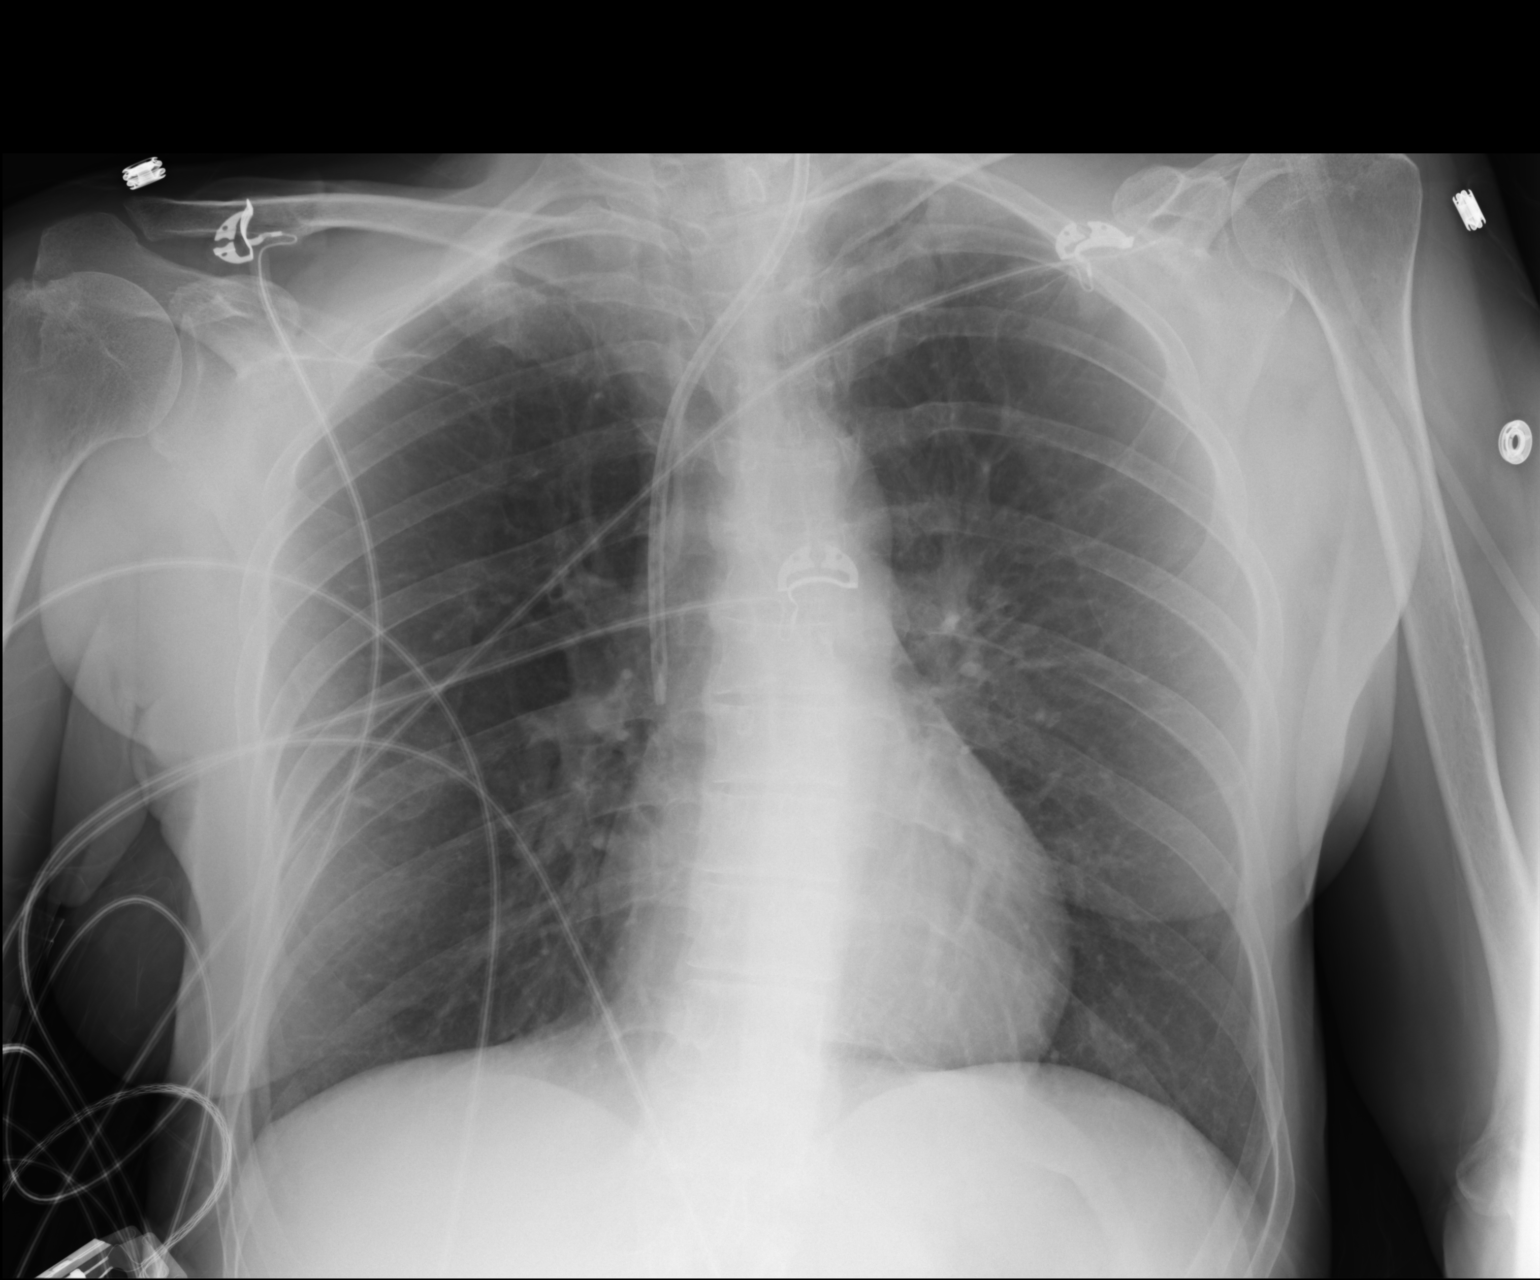

[1 of 1 positions shown; findings below may reference images not displayed]

FINDINGS: Stable position of non tunneled left IJ approach hemodialysis
catheter. The catheter tip is in good position at the distal SVC.
Stable cardiac and mediastinal contours. Slightly increased
pulmonary vascular congestion but no overt edema. No new airspace
consolidation, pleural effusion or pneumothorax. No acute osseous
abnormality.
IMPRESSION: 1. Slightly increased pulmonary vascular congestion without evidence
of pulmonary edema.
2. Stable position of left IJ hemodialysis catheter.

## 2014-11-29 IMAGING — CR DG CHEST 1V
1 series · 1 of 1 positions shown · non-contrast
Comparison: 04/27/2013

CLINICAL DATA: Central line placement.

EXAM:
CHEST - 1 VIEW

[AP]
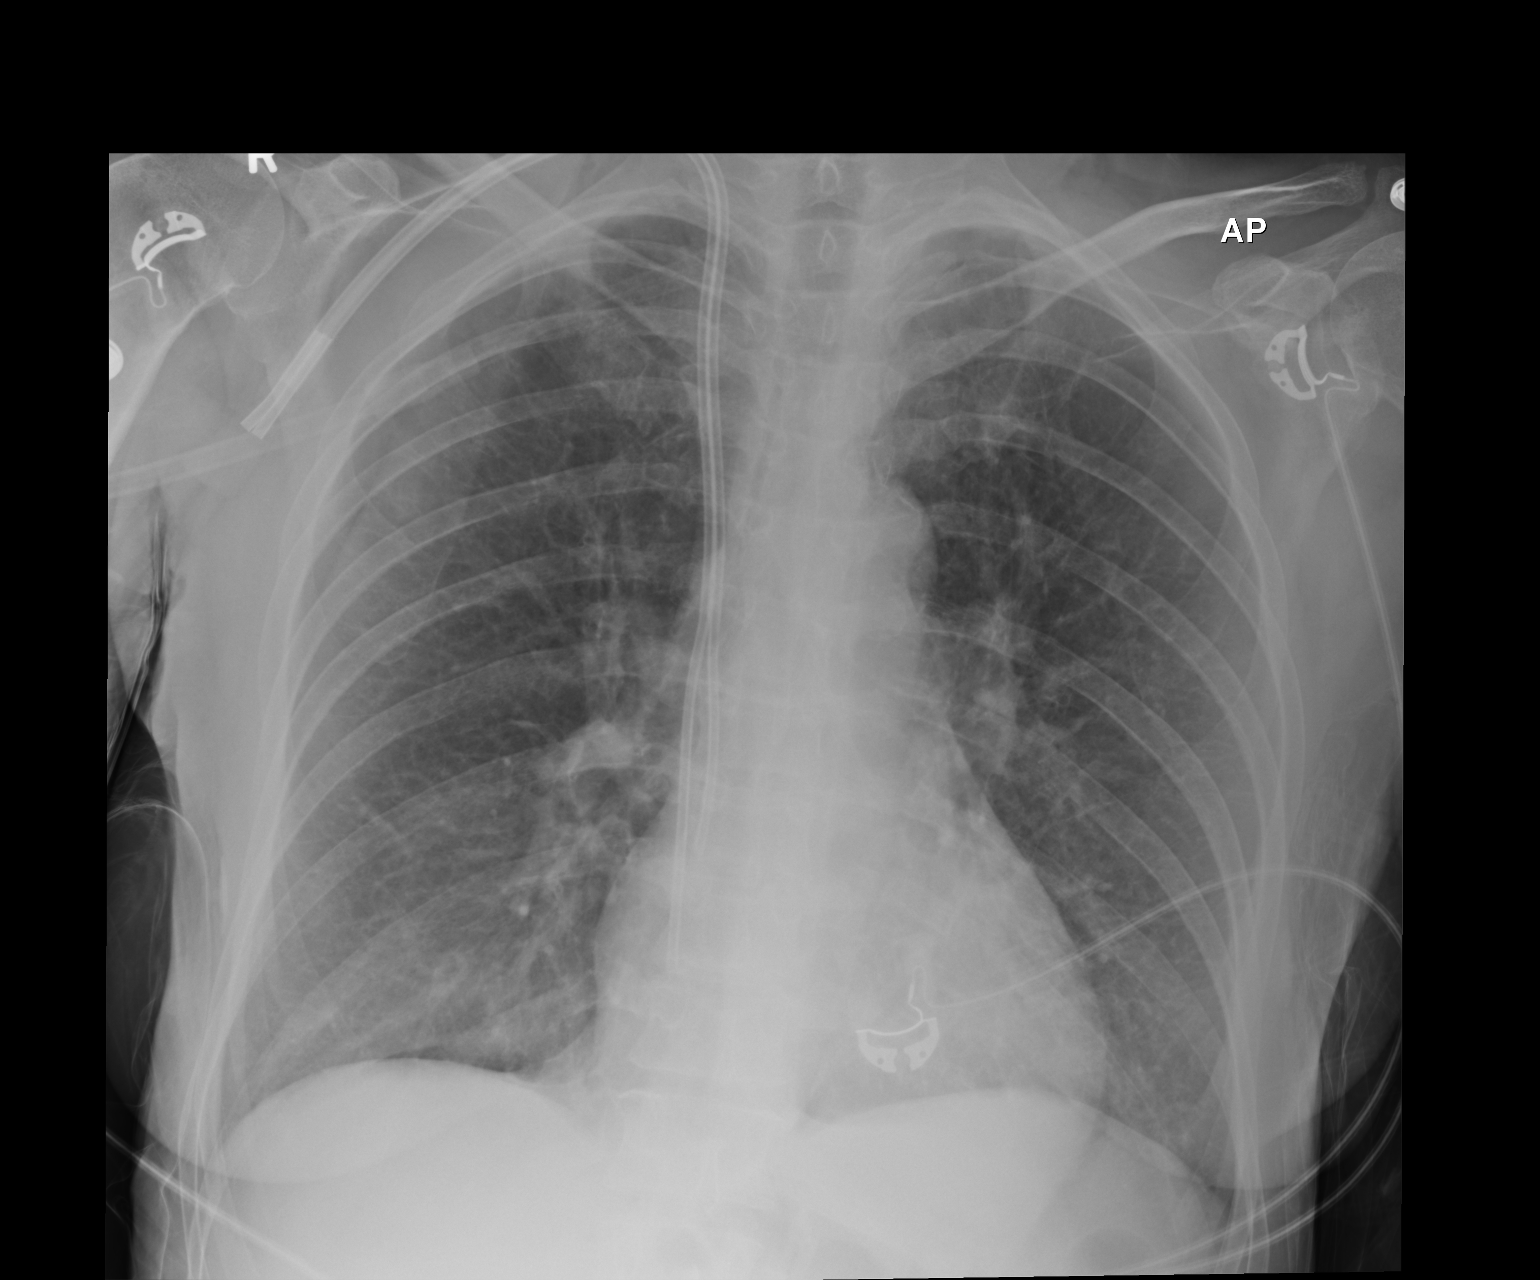

[1 of 1 positions shown; findings below may reference images not displayed]

FINDINGS: 3441 hrs. A new right IJ dialysis catheter is visualized with distal
tip positioned over the mid right atrium. There is no evidence for
pneumothorax. No pulmonary edema or focal lung consolidation. The
cardiopericardial silhouette is within normal limits for size.
Telemetry leads overlie the chest.
IMPRESSION: Right IJ central line tip overlies the mid right atrium. No
pneumothorax.
